# Patient Record
Sex: Male | Born: 1980 | Race: White | Hispanic: No | Marital: Married | Smoking: Never smoker
Health system: Southern US, Community
[De-identification: ages and names within clinical notes are randomized; demographics above are authoritative.]

## PROBLEM LIST (undated history)

## (undated) DIAGNOSIS — I10 Essential (primary) hypertension: Secondary | ICD-10-CM

## (undated) DIAGNOSIS — K200 Eosinophilic esophagitis: Secondary | ICD-10-CM

## (undated) DIAGNOSIS — Z8719 Personal history of other diseases of the digestive system: Secondary | ICD-10-CM

## (undated) DIAGNOSIS — K219 Gastro-esophageal reflux disease without esophagitis: Secondary | ICD-10-CM

## (undated) DIAGNOSIS — R002 Palpitations: Secondary | ICD-10-CM

## (undated) DIAGNOSIS — E559 Vitamin D deficiency, unspecified: Secondary | ICD-10-CM

## (undated) DIAGNOSIS — I493 Ventricular premature depolarization: Secondary | ICD-10-CM

## (undated) HISTORY — DX: Eosinophilic esophagitis: K20.0

## (undated) HISTORY — DX: Gastro-esophageal reflux disease without esophagitis: K21.9

## (undated) HISTORY — DX: Vitamin D deficiency, unspecified: E55.9

## (undated) HISTORY — DX: Palpitations: R00.2

---

## 2010-12-20 DIAGNOSIS — I493 Ventricular premature depolarization: Secondary | ICD-10-CM | POA: Insufficient documentation

## 2015-05-17 ENCOUNTER — Encounter: Payer: Self-pay | Admitting: Physician Assistant

## 2015-05-17 ENCOUNTER — Ambulatory Visit: Payer: Self-pay | Admitting: Physician Assistant

## 2015-05-17 VITALS — BP 132/82 | HR 65 | Temp 98.5°F

## 2015-05-17 DIAGNOSIS — R42 Dizziness and giddiness: Secondary | ICD-10-CM

## 2015-05-17 DIAGNOSIS — R002 Palpitations: Secondary | ICD-10-CM

## 2015-05-17 DIAGNOSIS — Z299 Encounter for prophylactic measures, unspecified: Secondary | ICD-10-CM

## 2015-05-17 NOTE — Patient Instructions (Signed)
Palpitations A palpitation is the feeling that your heartbeat is irregular. It may feel like your heart is fluttering or skipping a beat. It may also feel like your heart is beating faster than normal. This is usually not a serious problem. In some cases, you may need more medical tests. HOME CARE  Avoid:  Caffeine in coffee, tea, soft drinks, diet pills, and energy drinks.  Chocolate.  Alcohol.  Stop smoking if you smoke.  Reduce your stress and anxiety. Try:  A method that measures bodily functions so you can learn to control them (biofeedback).  Yoga.  Meditation.  Physical activity such as swimming, jogging, or walking.  Get plenty of rest and sleep. GET HELP IF:  Your fast or irregular heartbeat continues after 24 hours.  Your palpitations occur more often. GET HELP RIGHT AWAY IF:   You have chest pain.  You feel short of breath.  You have a very bad headache.  You feel dizzy or pass out (faint). MAKE SURE YOU:   Understand these instructions.  Will watch your condition.  Will get help right away if you are not doing well or get worse.   This information is not intended to replace advice given to you by your health care provider. Make sure you discuss any questions you have with your health care provider.   Document Released: 11/30/2007 Document Revised: 03/13/2014 Document Reviewed: 04/21/2011 Elsevier Interactive Patient Education 2016 Elsevier Inc. Dizziness Dizziness is a common problem. It makes you feel unsteady or lightheaded. You may feel like you are about to pass out (faint). Dizziness can lead to injury if you stumble or fall. Anyone can get dizzy, but dizziness is more common in older adults. This condition can be caused by a number of things, including:  Medicines.  Dehydration.  Illness. HOME CARE Following these instructions may help with your condition: Eating and Drinking  Drink enough fluid to keep your pee (urine) clear or pale  yellow. This helps to keep you from getting dehydrated. Try to drink more clear fluids, such as water.  Do not drink alcohol.  Limit how much caffeine you drink or eat if told by your doctor.  Limit how much salt you drink or eat if told by your doctor. Activity  Avoid making quick movements.  When you stand up from sitting in a chair, steady yourself until you feel okay.  In the morning, first sit up on the side of the bed. When you feel okay, stand slowly while you hold onto something. Do this until you know that your balance is fine.  Move your legs often if you need to stand in one place for a long time. Tighten and relax your muscles in your legs while you are standing.  Do not drive or use heavy machinery if you feel dizzy.  Avoid bending down if you feel dizzy. Place items in your home so that they are easy for you to reach without leaning over. Lifestyle  Do not use any tobacco products, including cigarettes, chewing tobacco, or electronic cigarettes. If you need help quitting, ask your doctor.  Try to lower your stress level, such as with yoga or meditation. Talk with your doctor if you need help. General Instructions  Watch your dizziness for any changes.  Take medicines only as told by your doctor. Talk with your doctor if you think that your dizziness is caused by a medicine that you are taking.  Tell a friend or a family member that you  are feeling dizzy. If he or she notices any changes in your behavior, have this person call your doctor.  Keep all follow-up visits as told by your doctor. This is important. GET HELP IF:  Your dizziness does not go away.  Your dizziness or light-headedness gets worse.  You feel sick to your stomach (nauseous).  You have trouble hearing.  You have new symptoms.  You are unsteady on your feet or you feel like the room is spinning. GET HELP RIGHT AWAY IF:  You throw up (vomit) or have diarrhea and are unable to eat or drink  anything.  You have trouble:  Talking.  Walking.  Swallowing.  Using your arms, hands, or legs.  You feel generally weak.  You are not thinking clearly or you have trouble forming sentences. It may take a friend or family member to notice this.  You have:  Chest pain.  Pain in your belly (abdomen).  Shortness of breath.  Sweating.  Your vision changes.  You are bleeding.  You have a headache.  You have neck pain or a stiff neck.  You have a fever.   This information is not intended to replace advice given to you by your health care provider. Make sure you discuss any questions you have with your health care provider.   Document Released: 02/09/2011 Document Revised: 07/07/2014 Document Reviewed: 02/16/2014 Elsevier Interactive Patient Education [employer].

## 2015-05-17 NOTE — Progress Notes (Signed)
S: here for eval of dizziness and sweating after eating, happened x 2, no cp/sob at time, sx went away but just wanted to f/u;  no hx of dm, takes bystolic for palpitations, followed by electrophysiologist in [place], Dr Posey Pronto with [facility];  otherwise pmhx is neg, fam hx neg, nonsmoker, etoh occasionally,   O: Vitals wnl, nad, lungs c t a, cv rrr, abd soft nontender bs normal all 4 quads, neuro intact  A: dizziness, palpitations well controlled with medication  P: labs today, drink plenty of fluids

## 2015-05-18 LAB — CMP12+LP+TP+TSH+6AC+PSA+CBC…
ALT: 26 IU/L (ref 0–44)
AST: 16 IU/L (ref 0–40)
Albumin/Globulin Ratio: 1.8 (ref 1.2–2.2)
Albumin: 4.6 g/dL (ref 3.5–5.5)
Alkaline Phosphatase: 69 IU/L (ref 39–117)
BASOS: 0 %
BUN/Creatinine Ratio: 12 (ref 8–19)
BUN: 11 mg/dL (ref 6–20)
Basophils Absolute: 0 10*3/uL (ref 0.0–0.2)
Bilirubin Total: 1 mg/dL (ref 0.0–1.2)
CALCIUM: 10 mg/dL (ref 8.7–10.2)
CHOL/HDL RATIO: 4.6 ratio (ref 0.0–5.0)
CREATININE: 0.95 mg/dL (ref 0.76–1.27)
Chloride: 100 mmol/L (ref 96–106)
Cholesterol, Total: 174 mg/dL (ref 100–199)
EOS (ABSOLUTE): 0.2 10*3/uL (ref 0.0–0.4)
ESTIMATED CHD RISK: 0.9 times avg. (ref 0.0–1.0)
Eos: 3 %
Free Thyroxine Index: 2.7 (ref 1.2–4.9)
GFR calc Af Amer: 119 mL/min/{1.73_m2} (ref >59)
GFR, EST NON AFRICAN AMERICAN: 103 mL/min/{1.73_m2} (ref >59)
GGT: 24 IU/L (ref 0–65)
GLUCOSE: 95 mg/dL (ref 65–99)
Globulin, Total: 2.6 g/dL (ref 1.5–4.5)
HDL: 38 mg/dL — ABNORMAL LOW (ref >39)
HEMATOCRIT: 47 % (ref 37.5–51.0)
Hemoglobin: 16.3 g/dL (ref 12.6–17.7)
IMMATURE GRANS (ABS): 0 10*3/uL (ref 0.0–0.1)
Immature Granulocytes: 0 %
Iron: 65 ug/dL (ref 38–169)
LDH: 124 IU/L (ref 121–224)
LDL Calculated: 119 mg/dL — ABNORMAL HIGH (ref 0–99)
LYMPHS ABS: 1.1 10*3/uL (ref 0.7–3.1)
LYMPHS: 22 %
MCH: 30.1 pg (ref 26.6–33.0)
MCHC: 34.7 g/dL (ref 31.5–35.7)
MCV: 87 fL (ref 79–97)
MONOS ABS: 0.4 10*3/uL (ref 0.1–0.9)
Monocytes: 9 %
Neutrophils Absolute: 3.3 10*3/uL (ref 1.4–7.0)
Neutrophils: 66 %
PHOSPHORUS: 3 mg/dL (ref 2.5–4.5)
POTASSIUM: 4 mmol/L (ref 3.5–5.2)
Platelets: 193 10*3/uL (ref 150–379)
Prostate Specific Ag, Serum: 1 ng/mL (ref 0.0–4.0)
RBC: 5.41 x10E6/uL (ref 4.14–5.80)
RDW: 13.8 % (ref 12.3–15.4)
SODIUM: 140 mmol/L (ref 134–144)
T3 Uptake Ratio: 28 % (ref 24–39)
T4 TOTAL: 9.5 ug/dL (ref 4.5–12.0)
TOTAL PROTEIN: 7.2 g/dL (ref 6.0–8.5)
TSH: 2.27 u[IU]/mL (ref 0.450–4.500)
Triglycerides: 84 mg/dL (ref 0–149)
URIC ACID: 5.8 mg/dL (ref 3.7–8.6)
VLDL Cholesterol Cal: 17 mg/dL (ref 5–40)
WBC: 5.1 10*3/uL (ref 3.4–10.8)

## 2015-05-18 LAB — VITAMIN D 25 HYDROXY (VIT D DEFICIENCY, FRACTURES): Vit D, 25-Hydroxy: 24 ng/mL — ABNORMAL LOW (ref 30.0–100.0)

## 2015-05-18 LAB — HEMOGLOBIN A1C
ESTIMATED AVERAGE GLUCOSE: 103 mg/dL
Hgb A1c MFr Bld: 5.2 % (ref 4.8–5.6)

## 2015-10-11 ENCOUNTER — Ambulatory Visit: Payer: Self-pay | Admitting: Physician Assistant

## 2015-10-11 ENCOUNTER — Encounter: Payer: Self-pay | Admitting: Physician Assistant

## 2015-10-11 VITALS — BP 109/70 | HR 63 | Temp 98.7°F

## 2015-10-11 DIAGNOSIS — J020 Streptococcal pharyngitis: Secondary | ICD-10-CM

## 2015-10-11 LAB — POCT RAPID STREP A (OFFICE): RAPID STREP A SCREEN: POSITIVE — AB

## 2015-10-11 MED ORDER — AMOXICILLIN 875 MG PO TABS: 875.0000 mg | ORAL_TABLET | Freq: Two times a day (BID) | ORAL | 0 refills | Status: DC

## 2015-10-11 NOTE — Progress Notes (Signed)
S: C/o sore throat and congestion for 3 days, no fever, chills, cp/sob, v/d; states son just diagnosed with strep throat and feels like his sx are a day behind his child's  Using otc meds:   O: PE: perrl eomi, normocephalic, tms dull, nasal mucosa red and swollen, throat injected, neck supple no lymph, lungs c t a, cv rrr, neuro intact, q strep +  A:  Acute strep pharyngitis  P: drink fluids, continue regular meds , use otc meds of choice, return if not improving in 5 days, return earlier if worsening , amoxil 875mg  bid x 10d

## 2016-07-07 ENCOUNTER — Ambulatory Visit: Payer: Self-pay | Admitting: Physician Assistant

## 2016-07-07 ENCOUNTER — Encounter: Payer: Self-pay | Admitting: Physician Assistant

## 2016-07-07 VITALS — BP 139/80 | HR 65 | Temp 98.4°F | Ht 73.0 in | Wt 255.0 lb

## 2016-07-07 DIAGNOSIS — Z0000 Encounter for general adult medical examination without abnormal findings: Secondary | ICD-10-CM

## 2016-07-07 DIAGNOSIS — Z0189 Encounter for other specified special examinations: Secondary | ICD-10-CM

## 2016-07-07 DIAGNOSIS — Z008 Encounter for other general examination: Secondary | ICD-10-CM

## 2016-07-07 NOTE — Progress Notes (Signed)
S: pt here for wellness physical and biometrics for insurance purposes, no complaints ros neg. PMH: palpitations (benign) followed by physiologist in [place]:  Nonsmoker, rare etoh, no drugs, married, Fam: parents are healthy , sister is healthy, denies CAD, cancer, dm, cholesterol problems in family hx  O: vitals wnl, nad, ENT wnl, neck supple no lymph, lungs c t a, cv rrr, abd soft nontender bs normal all 4 quads  A: wellness, biometric physical  P:  f/u prn, fasting labs today

## 2016-07-08 LAB — CMP12+LP+TP+TSH+6AC+CBC/D/PLT
ALBUMIN: 4.6 g/dL (ref 3.5–5.5)
ALT: 24 IU/L (ref 0–44)
AST: 23 IU/L (ref 0–40)
Albumin/Globulin Ratio: 1.8 (ref 1.2–2.2)
Alkaline Phosphatase: 75 IU/L (ref 39–117)
BASOS ABS: 0 10*3/uL (ref 0.0–0.2)
BASOS: 1 %
BILIRUBIN TOTAL: 2 mg/dL — AB (ref 0.0–1.2)
BUN / CREAT RATIO: 11 (ref 9–20)
BUN: 11 mg/dL (ref 6–20)
CALCIUM: 9.5 mg/dL (ref 8.7–10.2)
CHOLESTEROL TOTAL: 172 mg/dL (ref 100–199)
CREATININE: 0.97 mg/dL (ref 0.76–1.27)
Chloride: 99 mmol/L (ref 96–106)
Chol/HDL Ratio: 5.1 ratio — ABNORMAL HIGH (ref 0.0–5.0)
EOS (ABSOLUTE): 0.2 10*3/uL (ref 0.0–0.4)
EOS: 5 %
ESTIMATED CHD RISK: 1 times avg. (ref 0.0–1.0)
Free Thyroxine Index: 2.7 (ref 1.2–4.9)
GFR calc Af Amer: 115 mL/min/{1.73_m2} (ref >59)
GFR, EST NON AFRICAN AMERICAN: 99 mL/min/{1.73_m2} (ref >59)
GGT: 26 IU/L (ref 0–65)
GLUCOSE: 86 mg/dL (ref 65–99)
Globulin, Total: 2.6 g/dL (ref 1.5–4.5)
HDL: 34 mg/dL — ABNORMAL LOW (ref >39)
HEMOGLOBIN: 15.6 g/dL (ref 13.0–17.7)
Hematocrit: 44.7 % (ref 37.5–51.0)
IRON: 79 ug/dL (ref 38–169)
Immature Grans (Abs): 0 10*3/uL (ref 0.0–0.1)
Immature Granulocytes: 0 %
LDH: 161 IU/L (ref 121–224)
LDL Calculated: 107 mg/dL — ABNORMAL HIGH (ref 0–99)
LYMPHS ABS: 1.2 10*3/uL (ref 0.7–3.1)
Lymphs: 24 %
MCH: 29.3 pg (ref 26.6–33.0)
MCHC: 34.9 g/dL (ref 31.5–35.7)
MCV: 84 fL (ref 79–97)
MONOS ABS: 0.5 10*3/uL (ref 0.1–0.9)
Monocytes: 11 %
Neutrophils Absolute: 3 10*3/uL (ref 1.4–7.0)
Neutrophils: 59 %
PHOSPHORUS: 2.5 mg/dL (ref 2.5–4.5)
PLATELETS: 206 10*3/uL (ref 150–379)
Potassium: 4.9 mmol/L (ref 3.5–5.2)
RBC: 5.32 x10E6/uL (ref 4.14–5.80)
RDW: 13.5 % (ref 12.3–15.4)
SODIUM: 139 mmol/L (ref 134–144)
T3 UPTAKE RATIO: 31 % (ref 24–39)
T4, Total: 8.7 ug/dL (ref 4.5–12.0)
TSH: 2.34 u[IU]/mL (ref 0.450–4.500)
Total Protein: 7.2 g/dL (ref 6.0–8.5)
Triglycerides: 153 mg/dL — ABNORMAL HIGH (ref 0–149)
URIC ACID: 6.2 mg/dL (ref 3.7–8.6)
VLDL CHOLESTEROL CAL: 31 mg/dL (ref 5–40)
WBC: 4.9 10*3/uL (ref 3.4–10.8)

## 2016-07-08 LAB — VITAMIN D 25 HYDROXY (VIT D DEFICIENCY, FRACTURES): VIT D 25 HYDROXY: 24.1 ng/mL — AB (ref 30.0–100.0)

## 2016-07-08 LAB — HCV COMMENT:

## 2016-07-08 LAB — HEPATITIS C ANTIBODY (REFLEX)

## 2016-07-08 LAB — HIV ANTIBODY (ROUTINE TESTING W REFLEX): HIV Screen 4th Generation wRfx: NONREACTIVE

## 2016-08-18 ENCOUNTER — Encounter: Payer: Self-pay | Admitting: Family Medicine

## 2016-08-18 ENCOUNTER — Ambulatory Visit (INDEPENDENT_AMBULATORY_CARE_PROVIDER_SITE_OTHER): Payer: Managed Care, Other (non HMO) | Admitting: Family Medicine

## 2016-08-18 DIAGNOSIS — K219 Gastro-esophageal reflux disease without esophagitis: Secondary | ICD-10-CM | POA: Diagnosis not present

## 2016-08-18 DIAGNOSIS — G43109 Migraine with aura, not intractable, without status migrainosus: Secondary | ICD-10-CM

## 2016-08-18 DIAGNOSIS — I493 Ventricular premature depolarization: Secondary | ICD-10-CM

## 2016-08-18 MED ORDER — OMEPRAZOLE 40 MG PO CPDR: 40.0000 mg | DELAYED_RELEASE_CAPSULE | Freq: Every day | ORAL | 0 refills | Status: DC

## 2016-08-18 NOTE — Assessment & Plan Note (Signed)
Patient with history of complicated migraine. Has been stable for many years. No new symptoms. Was evaluated by neurology previously. Discussed monitoring. If he develops new or changing symptoms or persistent symptoms he should be evaluated.

## 2016-08-18 NOTE — Patient Instructions (Signed)
Nice to meet you. We will start you on Prilosec for reflux. Please take this daily for the next month and then see how your symptoms are doing. If they recur please let us know and we'll restart you on it and end up having you see GI. Please monitor your PVCs. If you have persistent symptoms please let us know or your cardiologist know. If you have any change in your complicated migraine symptoms please seek medical attention.

## 2016-08-18 NOTE — Progress Notes (Signed)
[name], MD Phone: [phone]  Eddie Lopez is a 36 y.o. male who presents today for new patient visit.  PVCs: Rarely get symptoms. Notes 1 week ago he had about 5-10 seconds of symptoms and he contacted his EP physician and they advised him to get a monitor so that he could check his heart rate when it occurs. Typically notes his blood pressures in the 120s over 70s. Takes Bystolic.  GERD: History of eosinophilic esophagitis. They tried steroids for that though that was not beneficial. He does get burning reflux symptoms fairly frequently. Does take Zantac and this is beneficial. Notes bread has been an exacerbating factor. EGD 5 years ago.  History of Migraine: No history of headache though about 10 years ago he started developing intermittent numbness that would alternate sides and typically occur in the upper and lower extremity on the same side at the same time. He saw neurology and they diagnosed it as a migraine variant. He's not had any changes in his symptoms over time. No persistent symptoms.  Active Ambulatory Problems    Diagnosis Date Noted  . PVC (premature ventricular contraction) 12/20/2010  . Complicated migraine 67/89/3810  . GERD (gastroesophageal reflux disease) 08/18/2016   Resolved Ambulatory Problems    Diagnosis Date Noted  . No Resolved Ambulatory Problems   Past Medical History:  Diagnosis Date  . GERD (gastroesophageal reflux disease)   . Palpitations     Family History  Problem Relation Age of Onset  . Throat cancer Maternal Grandfather     Social History   Social History  . Marital status: Married    Spouse name: N/A  . Number of children: N/A  . Years of education: N/A   Occupational History  . Not on file.   Social History Main Topics  . Smoking status: Never Smoker  . Smokeless tobacco: Never Used  . Alcohol use 0.0 oz/week     Comment: 1 beer a week at most  . Drug use: No  . Sexual activity: Yes   Other Topics Concern    . Not on file   Social History Narrative  . No narrative on file    ROS  General:  Negative for nexplained weight loss, fever Skin: Negative for new or changing mole, sore that won't heal HEENT: Negative for trouble hearing, trouble seeing, ringing in ears, mouth sores, hoarseness, change in voice, dysphagia. CV:  Positive for palpitations, Negative for chest pain, dyspnea, edema Resp: Negative for cough, dyspnea, hemoptysis GI: Negative for nausea, vomiting, diarrhea, constipation, abdominal pain, melena, hematochezia. GU: Negative for dysuria, incontinence, urinary hesitance, hematuria, vaginal or penile discharge, polyuria, sexual difficulty, lumps in testicle or breasts MSK: Negative for muscle cramps or aches, joint pain or swelling Neuro: Positive for numbness, Negative for headaches, weakness, dizziness, passing out/fainting Psych: Negative for depression, anxiety, memory problems  Objective  Physical Exam Vitals:   08/18/16 1319  BP: 140/88  Pulse: 65  Temp: 98.8 F (37.1 C)    BP Readings from Last 3 Encounters:  08/18/16 140/88  07/07/16 139/80  10/11/15 109/70   Wt Readings from Last 3 Encounters:  08/18/16 249 lb 3.2 oz (113 kg)  07/07/16 255 lb (115.7 kg)    Physical Exam  Constitutional: No distress.  HENT:  Head: Normocephalic and atraumatic.  Mouth/Throat: Oropharynx is clear and moist. No oropharyngeal exudate.  Eyes: Conjunctivae are normal. Pupils are equal, round, and reactive to light.  Cardiovascular: Normal rate, regular rhythm and normal heart sounds.  Pulmonary/Chest: Effort normal and breath sounds normal.  Abdominal: Soft. Bowel sounds are normal. He exhibits no distension. There is no tenderness. There is no rebound and no guarding.  Musculoskeletal: He exhibits no edema.  Neurological: He is alert. Gait normal.  CN 2-12 intact, 5/5 strength in bilateral biceps, triceps, grip, quads, hamstrings, plantar and dorsiflexion, sensation to  light touch intact in bilateral UE and LE  Skin: Skin is warm and dry. He is not diaphoretic.  Psychiatric: Mood and affect normal.     Assessment/Plan:   PVC (premature ventricular contraction) Occasional symptoms. Followed by EP. Advised if he develops persistent symptoms or new symptoms to be evaluated. He'll let us know when he wants to see a local cardiologist.  Complicated migraine Patient with history of complicated migraine. Has been stable for many years. No new symptoms. Was evaluated by neurology previously. Discussed monitoring. If he develops new or changing symptoms or persistent symptoms he should be evaluated.  GERD (gastroesophageal reflux disease) Long history of this. We'll trial Prilosec. If not improved after a month of use refer to GI.   No orders of the defined types were placed in this encounter.   Meds ordered this encounter  Medications  . omeprazole (PRILOSEC) 40 MG capsule    Sig: Take 1 capsule (40 mg total) by mouth daily.    Dispense:  30 capsule    Refill:  0     [name], MD [place]

## 2016-08-18 NOTE — Assessment & Plan Note (Signed)
Long history of this. We'll trial Prilosec. If not improved after a month of use refer to GI.

## 2016-08-18 NOTE — Assessment & Plan Note (Signed)
Occasional symptoms. Followed by EP. Advised if he develops persistent symptoms or new symptoms to be evaluated. He'll let us know when he wants to see a local cardiologist.

## 2016-09-19 ENCOUNTER — Encounter: Payer: Self-pay | Admitting: Family Medicine

## 2016-09-20 MED ORDER — OMEPRAZOLE 40 MG PO CPDR: 40.0000 mg | DELAYED_RELEASE_CAPSULE | Freq: Every day | ORAL | 0 refills | Status: DC

## 2016-09-20 NOTE — Telephone Encounter (Signed)
Was put on Prilosec on 6/15, seems to have worked, requesting refill and referral to GI, please advise, thanks

## 2016-09-21 ENCOUNTER — Other Ambulatory Visit: Payer: Self-pay | Admitting: Family Medicine

## 2016-10-19 ENCOUNTER — Other Ambulatory Visit: Payer: Self-pay | Admitting: Family Medicine

## 2016-10-19 ENCOUNTER — Other Ambulatory Visit: Payer: Self-pay

## 2016-10-20 MED ORDER — OMEPRAZOLE 40 MG PO CPDR: 40.0000 mg | DELAYED_RELEASE_CAPSULE | Freq: Every day | ORAL | 3 refills | Status: DC

## 2017-01-15 ENCOUNTER — Encounter: Payer: Self-pay | Admitting: Family Medicine

## 2017-01-15 DIAGNOSIS — K219 Gastro-esophageal reflux disease without esophagitis: Secondary | ICD-10-CM

## 2017-01-16 NOTE — Telephone Encounter (Signed)
Referral placed.

## 2017-01-19 ENCOUNTER — Ambulatory Visit: Payer: Managed Care, Other (non HMO) | Admitting: Gastroenterology

## 2017-01-19 ENCOUNTER — Other Ambulatory Visit: Payer: Self-pay

## 2017-01-19 ENCOUNTER — Encounter: Payer: Self-pay | Admitting: Gastroenterology

## 2017-01-19 VITALS — BP 129/73 | HR 66 | Temp 98.4°F | Ht 73.0 in | Wt 257.0 lb

## 2017-01-19 DIAGNOSIS — K200 Eosinophilic esophagitis: Secondary | ICD-10-CM

## 2017-01-19 DIAGNOSIS — K219 Gastro-esophageal reflux disease without esophagitis: Secondary | ICD-10-CM

## 2017-01-19 DIAGNOSIS — K2100 Gastro-esophageal reflux disease with esophagitis, without bleeding: Secondary | ICD-10-CM

## 2017-01-19 NOTE — Progress Notes (Signed)
Eddie Antigua, MD [address] [place], [place], [place], [address] [address], [place], [place], [place], [address] Phone: [phone]  Fax: [phone]  Consultation  Referring Provider:     [name], MD Primary Care Physician:  Eddie Haven, MD Primary Gastroenterologist:  Eddie Manifold, MD         Reason for consultaiton: Acid reflux ReDate of Consultation:  01/19/2017         HPI:   Eddie Lopez is a 36 y.o. male presents for evaluation of heartburn. Reportedly had an EGD 4-5 yrs ago for acid reflux.  He did not have any symptoms of food impaction at that time or now.  EGD reportedly showed eosinophilic esophagitis at the time.  He did not have any reports or pathology reports from this procedure.  He was started on PPI at that time and then transitioned over to a swallowed steroid.  He states the PPI helped with the swallowed steroid did not.  Over time he stopped taking both medications.  In June or July of this year his heartburn returned and his primary care provider prescribed Prilosec 40 mg daily in the morning.  This has helped his daytime symptoms but he is continuing to have heartburn at night.  No weight loss or abdominal pain.  No food impactions.  No dysphagia.  No nausea vomiting.  No altered bowel habits.  He elevates the head of his bed at night.  He tries to not eat right before bedtime.  Past Medical History:  Diagnosis Date  . GERD (gastroesophageal reflux disease)   . Palpitations     History reviewed. No pertinent surgical history.  Prior to Admission medications   Medication Sig Start Date End Date Taking? Authorizing Provider  BYSTOLIC 2.5 MG tablet  08/07/99  Yes [provider]  omeprazole (PRILOSEC) 40 MG capsule Take 1 capsule (40 mg total) by mouth daily. 10/20/16  Yes Eddie Haven, MD    Family History  Problem Relation Age of Onset  . Throat cancer Maternal Grandfather      Social History    Tobacco Use  . Smoking status: Never Smoker  . Smokeless tobacco: Never Used  Substance Use Topics  . Alcohol use: Yes    Alcohol/week: 0.0 oz    Comment: 1 beer a week at most  . Drug use: No    Allergies as of 01/19/2017  . (No Known Allergies)    Review of Systems:    All systems reviewed and negative except where noted in HPI.   Physical Exam:  Vital signs in last 24 hours: @VSRANGES @vital  signs reviewed recent labs   General:   Pleasant, cooperative in NAD Head:  Normocephalic and atraumatic. Eyes:   No icterus.   Conjunctiva pink. PERRLA. Ears:  Normal auditory acuity. Neck:  Supple; no masses or thyroidomegaly Lungs: Respirations even and unlabored. Lungs clear to auscultation bilaterally.   No wheezes, crackles, or rhonchi.  Heart:  Regular rate and rhythm;  Without murmur, clicks, rubs or gallops Abdomen:  Soft, nondistended, nontender. Normal bowel sounds. No appreciable masses or hepatomegaly.  No rebound or guarding.  Neurologic:  Alert and oriented x3;  grossly normal neurologically. Skin:  Intact without significant lesions or rashes. Cervical Nodes:  No significant cervical adenopathy. Psych:  Alert and cooperative. Normal affect.  LAB RESULTS: No results for input(s): WBC, HGB, HCT, PLT in the last 72 hours. BMET No results for input(s): NA, K, CL, CO2, GLUCOSE,  BUN, CREATININE, CALCIUM in the last 72 hours. LFT No results for input(s): PROT, ALBUMIN, AST, ALT, ALKPHOS, BILITOT, BILIDIR, IBILI in the last 72 hours. PT/INR No results for input(s): LABPROT, INR in the last 72 hours. Labs from May 2018 reviewed  STUDIES: No results found.    Impression / Plan:   Eddie Lopez is a 36 y.o. y/o male with symptoms of heartburn that have improved with Prilosec but are not completely resolved and reportedly history of eosinophilic esophagitis for 5 years ago when he presented with daily acid reflux  Due to his history of reported eosinophilic  esophagitis and symptoms of breakthrough acid reflux will perform EGD for evaluation of underlying eosinophilic esophagitis, Barrett's or reflux esophagitis.  Due to patient's  symptoms of heartburn at night despite PPI 40 mg in the morning, will have patient start taking Zantac at bedtime.  Education for acid reflux lifestyle modifications provided to patient and handout given for the same  Adverse effects of chronic PPIs including CKD, infections, pneumonia, C. difficile, bone loss, electrolyte abnormalities were discussed with the patient and he verbalized understanding  I have discussed alternative options, risks & benefits,  which include, but are not limited to, bleeding, infection, perforation,respiratory complication & drug reaction.  The patient agrees with this plan & written consent will be obtained.    Patient prefers to schedule this in January due to his insurance.  I have discussed any alarm symptoms that he should contact us for to discuss the need for doing this earlier.  These include symptoms of dysphagia, food impactions, weakness, abdominal pain, or any other reason for concern.  Of asked him to contact us if these occur or go to the ER  Thank you for involving me in the care of this patient.      Eddie Manifold, MD  01/19/2017, 11:11 AM

## 2017-02-21 ENCOUNTER — Encounter: Payer: Self-pay | Admitting: Family Medicine

## 2017-02-22 MED ORDER — OMEPRAZOLE 40 MG PO CPDR: 40.0000 mg | DELAYED_RELEASE_CAPSULE | Freq: Every day | ORAL | 1 refills | Status: DC

## 2017-03-08 ENCOUNTER — Encounter: Payer: Self-pay | Admitting: *Deleted

## 2017-03-09 ENCOUNTER — Ambulatory Visit: Payer: Managed Care, Other (non HMO) | Admitting: Anesthesiology

## 2017-03-09 ENCOUNTER — Ambulatory Visit
Admission: RE | Admit: 2017-03-09 | Discharge: 2017-03-09 | Disposition: A | Discharge status: Home / Self Care | Payer: Managed Care, Other (non HMO) | Source: Ambulatory Visit | Attending: Gastroenterology | Admitting: Gastroenterology

## 2017-03-09 ENCOUNTER — Encounter: Payer: Self-pay | Admitting: Anesthesiology

## 2017-03-09 ENCOUNTER — Encounter
Admission: RE | Disposition: A | Discharge status: Home / Self Care | Payer: Self-pay | Source: Ambulatory Visit | Attending: Gastroenterology

## 2017-03-09 ENCOUNTER — Other Ambulatory Visit: Payer: Self-pay

## 2017-03-09 DIAGNOSIS — K2100 Gastro-esophageal reflux disease with esophagitis, without bleeding: Secondary | ICD-10-CM

## 2017-03-09 DIAGNOSIS — K2289 Other specified disease of esophagus: Secondary | ICD-10-CM

## 2017-03-09 DIAGNOSIS — K228 Other specified diseases of esophagus: Secondary | ICD-10-CM | POA: Diagnosis not present

## 2017-03-09 DIAGNOSIS — K2950 Unspecified chronic gastritis without bleeding: Secondary | ICD-10-CM | POA: Insufficient documentation

## 2017-03-09 DIAGNOSIS — Z8719 Personal history of other diseases of the digestive system: Secondary | ICD-10-CM | POA: Insufficient documentation

## 2017-03-09 DIAGNOSIS — K209 Esophagitis, unspecified without bleeding: Secondary | ICD-10-CM

## 2017-03-09 DIAGNOSIS — K3189 Other diseases of stomach and duodenum: Secondary | ICD-10-CM

## 2017-03-09 DIAGNOSIS — R12 Heartburn: Secondary | ICD-10-CM | POA: Diagnosis present

## 2017-03-09 HISTORY — PX: ESOPHAGOGASTRODUODENOSCOPY (EGD) WITH PROPOFOL: SHX5813

## 2017-03-09 SURGERY — ESOPHAGOGASTRODUODENOSCOPY (EGD) WITH PROPOFOL
Anesthesia: General

## 2017-03-09 MED ORDER — MIDAZOLAM HCL 2 MG/2ML IJ SOLN
INTRAMUSCULAR | Status: DC | PRN
  Administered 2017-03-09: 2 mg via INTRAVENOUS

## 2017-03-09 MED ORDER — PROPOFOL 500 MG/50ML IV EMUL
INTRAVENOUS | Status: AC
  Filled 2017-03-09: qty 50

## 2017-03-09 MED ORDER — LIDOCAINE HCL (PF) 2 % IJ SOLN
INTRAMUSCULAR | Status: AC
Start: 2017-03-09 — End: 2017-03-09
  Filled 2017-03-09: qty 10

## 2017-03-09 MED ORDER — FENTANYL CITRATE (PF) 100 MCG/2ML IJ SOLN
INTRAMUSCULAR | Status: AC
  Filled 2017-03-09: qty 2

## 2017-03-09 MED ORDER — FENTANYL CITRATE (PF) 100 MCG/2ML IJ SOLN
INTRAMUSCULAR | Status: DC | PRN
  Administered 2017-03-09 (×2): 50 ug via INTRAVENOUS

## 2017-03-09 MED ORDER — SODIUM CHLORIDE 0.9 % IV SOLN
INTRAVENOUS | Status: DC
  Administered 2017-03-09: 1000 mL via INTRAVENOUS

## 2017-03-09 MED ORDER — PROPOFOL 500 MG/50ML IV EMUL
INTRAVENOUS | Status: DC | PRN
  Administered 2017-03-09: 120 ug/kg/min via INTRAVENOUS

## 2017-03-09 MED ORDER — MIDAZOLAM HCL 2 MG/2ML IJ SOLN
INTRAMUSCULAR | Status: AC
  Filled 2017-03-09: qty 2

## 2017-03-09 MED ORDER — LIDOCAINE HCL (CARDIAC) 20 MG/ML IV SOLN
INTRAVENOUS | Status: DC | PRN
  Administered 2017-03-09: 50 mg via INTRAVENOUS

## 2017-03-09 NOTE — Anesthesia Procedure Notes (Signed)
Performed by: Hedda Slade, CRNA Pre-anesthesia Checklist: Patient identified, Emergency Drugs available, Suction available, Patient being monitored and Timeout performed Patient Re-evaluated:Patient Re-evaluated prior to induction Oxygen Delivery Method: Nasal cannula Preoxygenation: Pre-oxygenation with 100% oxygen Induction Type: IV induction Airway Equipment and Method: Bite block Placement Confirmation: CO2 detector and positive ETCO2

## 2017-03-09 NOTE — Transfer of Care (Signed)
Immediate Anesthesia Transfer of Care Note  Patient: Eddie Lopez  Procedure(s) Performed: ESOPHAGOGASTRODUODENOSCOPY (EGD) WITH PROPOFOL (N/A )  Patient Location: PACU  Anesthesia Type:General  Level of Consciousness: awake and sedated  Airway & Oxygen Therapy: Patient Spontanous Breathing and Patient connected to nasal cannula oxygen  Post-op Assessment: Report given to RN and Post -op Vital signs reviewed and stable  Post vital signs: Reviewed and stable  Last Vitals:  Vitals:   03/09/17 1232  BP: 138/70  Pulse: 67  Resp: 18  Temp: (!) 36.3 C  SpO2: 100%    Last Pain:  Vitals:   03/09/17 1232  TempSrc: Tympanic         Complications: No apparent anesthesia complications

## 2017-03-09 NOTE — Anesthesia Postprocedure Evaluation (Signed)
Anesthesia Post Note  Patient: Eddie Lopez  Procedure(s) Performed: ESOPHAGOGASTRODUODENOSCOPY (EGD) WITH PROPOFOL (N/A )  Patient location during evaluation: Endoscopy Anesthesia Type: General Level of consciousness: awake and alert Pain management: pain level controlled Vital Signs Assessment: post-procedure vital signs reviewed and stable Respiratory status: spontaneous breathing, nonlabored ventilation, respiratory function stable and patient connected to nasal cannula oxygen Cardiovascular status: blood pressure returned to baseline and stable Postop Assessment: no apparent nausea or vomiting Anesthetic complications: no     Last Vitals:  Vitals:   03/09/17 1414 03/09/17 1424  BP: 103/73 102/65  Pulse: 60 (!) 58  Resp: 13 16  Temp:    SpO2: 98% 98%    Last Pain:  Vitals:   03/09/17 1404  TempSrc: Tympanic                 Martha Clan

## 2017-03-09 NOTE — Anesthesia Post-op Follow-up Note (Signed)
Anesthesia QCDR form completed.        

## 2017-03-09 NOTE — Anesthesia Preprocedure Evaluation (Signed)
Anesthesia Evaluation  Patient identified by MRN, date of birth, ID band Patient awake    Reviewed: Allergy & Precautions, NPO status , Patient's Chart, lab work & pertinent test results  History of Anesthesia Complications Negative for: history of anesthetic complications  Airway Mallampati: II       Dental   Pulmonary neg sleep apnea, neg COPD,           Cardiovascular (-) hypertension(-) Past MI and (-) CHF + dysrhythmias (occassional palpitations) (-) Valvular Problems/Murmurs     Neuro/Psych neg Seizures    GI/Hepatic Neg liver ROS, GERD  Medicated and Poorly Controlled,  Endo/Other  neg diabetes  Renal/GU negative Renal ROS     Musculoskeletal   Abdominal   Peds  Hematology   Anesthesia Other Findings   Reproductive/Obstetrics                            Anesthesia Physical Anesthesia Plan  ASA: II  Anesthesia Plan: General   Post-op Pain Management:    Induction: Intravenous  PONV Risk Score and Plan: 2 and TIVA and Propofol infusion  Airway Management Planned: Nasal Cannula  Additional Equipment:   Intra-op Plan:   Post-operative Plan:   Informed Consent: I have reviewed the patients History and Physical, chart, labs and discussed the procedure including the risks, benefits and alternatives for the proposed anesthesia with the patient or authorized representative who has indicated his/her understanding and acceptance.     Plan Discussed with:   Anesthesia Plan Comments:         Anesthesia Quick Evaluation

## 2017-03-09 NOTE — H&P (Signed)
  [name], MD [address], [place], [place], [place], [address] [address], [place], [place], [place], [address] Phone: [phone]  Fax: [phone]  Primary Care Physician:  [name], MD   Pre-Procedure History & Physical: HPI:  Eddie Lopez is a 37 y.o. male is here for an EGD.   Past Medical History:  Diagnosis Date  . GERD (gastroesophageal reflux disease)   . Palpitations     History reviewed. No pertinent surgical history.  Prior to Admission medications   Medication Sig Start Date End Date Taking? Authorizing Provider  BYSTOLIC 2.5 MG tablet  5/0/56   [provider]  omeprazole (PRILOSEC) 40 MG capsule Take 1 capsule (40 mg total) by mouth daily. 02/22/17   [name], MD    Allergies as of 01/19/2017  . (No Known Allergies)    Family History  Problem Relation Age of Onset  . Throat cancer Maternal Grandfather     Social History   Socioeconomic History  . Marital status: Married    Spouse name: Not on file  . Number of children: Not on file  . Years of education: Not on file  . Highest education level: Not on file  Social Needs  . Financial resource strain: Not on file  . Food insecurity - worry: Not on file  . Food insecurity - inability: Not on file  . Transportation needs - medical: Not on file  . Transportation needs - non-medical: Not on file  Occupational History  . Not on file  Tobacco Use  . Smoking status: Never Smoker  . Smokeless tobacco: Never Used  Substance and Sexual Activity  . Alcohol use: Yes    Alcohol/week: 0.0 oz    Comment: 1 beer a week at most  . Drug use: No  . Sexual activity: Yes  Other Topics Concern  . Not on file  Social History Narrative  . Not on file    Review of Systems: See HPI, otherwise negative ROS  Physical Exam: There were no vitals taken for this visit. General:   Alert,  pleasant and cooperative in NAD Head:  Normocephalic and atraumatic. Neck:   Supple; no masses or thyromegaly. Lungs:  Clear throughout to auscultation, normal respiratory effort.    Heart:  +S1, +S2, Regular rate and rhythm, No edema. Abdomen:  Soft, nontender and nondistended. Normal bowel sounds, without guarding, and without rebound.   Neurologic:  Alert and  oriented x4;  grossly normal neurologically.  Impression/Plan: Eddie Lopez is here for an EGD to be performed for Heartburn, EoE  Risks, benefits, limitations, and alternatives regarding EGD have been reviewed with the patient.  Questions have been answered.  All parties agreeable.   Virgel Manifold, MD  03/09/2017, 12:12 PM

## 2017-03-09 NOTE — Op Note (Signed)
[facility] Patient Name: Eddie Lopez Procedure Date: 03/09/2017 1:34 PM MRN: [id number] Account #: [id number] Date of Birth: 10-03-1980 Admit Type: Outpatient Age: 37 Room: [facility] ENDO ROOM 1 Gender: Male Note Status: Finalized Procedure:            Upper GI endoscopy Indications:          Heartburn, History of Eosinophilic Esophagitis reported                        by patient Providers:            Neelie Welshans B. Bonna Gains MD, MD Referring MD:         Angela Adam. Caryl Bis (Referring MD) Medicines:            Monitored Anesthesia Care Complications:        No immediate complications. Procedure:            Pre-Anesthesia Assessment:                       - The risks and benefits of the procedure and the                        sedation options and risks were discussed with the                        patient. All questions were answered and informed                        consent was obtained.                       - Patient identification and proposed procedure were                        verified prior to the procedure.                       - ASA Grade Assessment: II - A patient with mild                        systemic disease.                       After obtaining informed consent, the endoscope was                        passed under direct vision. Throughout the procedure,                        the patient's blood pressure, pulse, and oxygen                        saturations were monitored continuously. The Endoscope                        was introduced through the mouth, and advanced to the                        second part of duodenum. The upper GI endoscopy was  accomplished with ease. The patient tolerated the                        procedure well. Findings:      Mucosal changes including feline appearance and longitudinal furrows       were found in the entire esophagus. Biopsies were obtained from the   proximal and distal esophagus with cold forceps for histology of       suspected eosinophilic esophagitis.      The Z-line was found 39 cm from the incisors.      One tongue of salmon-colored mucosa was present from 38 to 39 cm. No       other visible abnormalities were present. The maximum longitudinal       extent of these esophageal mucosal changes was 1 cm in length. Biopsies       were taken with a cold forceps for histology.      Patchy mildly erythematous mucosa without bleeding was found in the       gastric antrum. Biopsies were taken with a cold forceps for histology.      The duodenal bulb and second portion of the duodenum were normal. Impression:           - Esophageal mucosal changes suggestive of eosinophilic                        esophagitis. Biopsied.                       - Z-line, 39 cm from the incisors.                       - Salmon-colored mucosa. Biopsied.                       - Erythematous mucosa in the antrum. Biopsied.                       - Normal duodenal bulb and second portion of the                        duodenum. Recommendation:       - Await pathology results.                       - Continue present medications.                       - Discharge patient to home.                       - Follow an antireflux regimen.                       - Return to my office in 4 weeks. Procedure Code(s):    --- Professional ---                       [phone], Esophagogastroduodenoscopy, flexible, transoral;                        with biopsy, single or multiple Diagnosis Code(s):    --- Professional ---  K20.9, Esophagitis, unspecified                       K22.8, Other specified diseases of esophagus                       K31.89, Other diseases of stomach and duodenum                       R12, Heartburn CPT copyright 2016 American Medical Association. All rights reserved. The codes documented in this report are preliminary and upon coder  review may  be revised to meet current compliance requirements.  [name], MD Margretta Sidle B. Bonna Gains MD, MD 03/09/2017 2:01:15 PM This report has been signed electronically. Number of Addenda: 0 Note Initiated On: 03/09/2017 1:34 PM      [facility]

## 2017-03-12 ENCOUNTER — Encounter: Payer: Self-pay | Admitting: Gastroenterology

## 2017-03-15 ENCOUNTER — Encounter: Payer: Self-pay | Admitting: Gastroenterology

## 2017-03-15 LAB — SURGICAL PATHOLOGY

## 2017-03-26 ENCOUNTER — Telehealth: Payer: Self-pay | Admitting: Gastroenterology

## 2017-03-26 NOTE — Telephone Encounter (Signed)
Left voice message for patient to call and move his appt. Dr is scoping

## 2017-04-10 ENCOUNTER — Ambulatory Visit: Payer: Managed Care, Other (non HMO) | Admitting: Gastroenterology

## 2017-04-12 ENCOUNTER — Telehealth: Payer: Self-pay

## 2017-04-12 ENCOUNTER — Encounter: Payer: Self-pay | Admitting: Gastroenterology

## 2017-04-12 ENCOUNTER — Ambulatory Visit: Payer: Managed Care, Other (non HMO) | Admitting: Gastroenterology

## 2017-04-12 ENCOUNTER — Other Ambulatory Visit: Payer: Self-pay

## 2017-04-12 VITALS — BP 138/83 | HR 66 | Ht 73.0 in | Wt 261.8 lb

## 2017-04-12 DIAGNOSIS — K2289 Other specified disease of esophagus: Secondary | ICD-10-CM

## 2017-04-12 DIAGNOSIS — K219 Gastro-esophageal reflux disease without esophagitis: Secondary | ICD-10-CM | POA: Diagnosis not present

## 2017-04-12 DIAGNOSIS — K228 Other specified diseases of esophagus: Secondary | ICD-10-CM | POA: Diagnosis not present

## 2017-04-12 DIAGNOSIS — R17 Unspecified jaundice: Secondary | ICD-10-CM

## 2017-04-12 MED ORDER — OMEPRAZOLE 20 MG PO CPDR: 20.0000 mg | DELAYED_RELEASE_CAPSULE | Freq: Every day | ORAL | 6 refills | Status: DC

## 2017-04-12 NOTE — Telephone Encounter (Signed)
Pt scheduled for ultrasound on 04/18/17 at 11:00 (arrival time 10:45) at the [facility] [address]. Pt to be NPO after midnight. Will have labs done also this day. Pt aware.

## 2017-04-12 NOTE — Telephone Encounter (Signed)
Left message to contact office regarding lab work needed and ultrasound needs to be ordered.

## 2017-04-12 NOTE — Patient Instructions (Signed)
F/U 6 months

## 2017-04-12 NOTE — Progress Notes (Signed)
Eddie Antigua, MD [address]  [place]  [place], [place] [address]  Main: [phone]  Fax: [phone]   Primary Care Physician: Eddie Haven, MD  Primary Gastroenterologist:  Dr. Vonda Lopez  Chief Complaint  Patient presents with  . Follow-up    EGD    HPI: Eddie Lopez is a 37 y.o. male here for follow-up.  He is currently on Protonix 40 mg every morning, and Zantac at bedtime for GERD versus EOE.  Since being on Zantac, starting January 19, 2017, his heartburn has completely resolved.  He has no symptoms of dysphagia, no previous food impactions, and no current acid reflux.  No abdominal pain.  No altered bowel habits.  No blood in stool.  No nausea vomiting.  He has instituted lifestyle measures, including weight loss, not eating 3 of his before bedtime, elevating head of bed at night, and avoiding exacerbating foods.  His EGD on March 09, 2017, showed endoscopic features of eosinophilic esophagitis.  Pathology report did not show any eosinophils in the distal or proximal esophagus, and reported changes compatible with reflux.  Stomach biopsies showed mild chronic gastritis, negative for H. pylori.  A small island of salmon colored mucosa and biopsies showed reflux gastroesophagitis, negative for goblet cells, dysplasia and malignancy.  As per previous notes: Reportedly had an EGD 4-5 yrs ago for acid reflux.  He did not have any symptoms of food impaction at that time or now.  EGD reportedly showed eosinophilic esophagitis at the time.  He did not have any reports or pathology reports from this procedure.  He was started on PPI at that time and then transitioned over to a swallowed steroid.  He states the PPI helped with the swallowed steroid did not.  Over time he stopped taking both medications.  In June or July of this year his heartburn returned and his primary care provider prescribed Prilosec 40 mg daily in the morning.  This has helped his  daytime symptoms but he is continuing to have heartburn at night.    Current Outpatient Medications  Medication Sig Dispense Refill  . BYSTOLIC 2.5 MG tablet   0   No current facility-administered medications for this visit.     Allergies as of 04/12/2017  . (No Known Allergies)    ROS:  General: Negative for anorexia, weight loss, fever, chills, fatigue, weakness. ENT: Negative for hoarseness, difficulty swallowing , nasal congestion. CV: Negative for chest pain, angina, palpitations, dyspnea on exertion, peripheral edema.  Respiratory: Negative for dyspnea at rest, dyspnea on exertion, cough, sputum, wheezing.  GI: See history of present illness. GU:  Negative for dysuria, hematuria, urinary incontinence, urinary frequency, nocturnal urination.  Endo: Negative for unusual weight change.    Physical Examination:   BP 138/83   Pulse 66   Ht 6\' 1"  (1.854 m)   Wt 261 lb 12.8 oz (118.8 kg)   BMI 34.54 kg/m   General: Well-nourished, well-developed in no acute distress.  Eyes: No icterus. Conjunctivae pink. Mouth: Oropharyngeal mucosa moist and pink , no lesions erythema or exudate. Lungs: Clear to auscultation bilaterally. Non-labored. Heart: Regular rate and rhythm, no murmurs rubs or gallops.  Abdomen: Bowel sounds are normal, nontender, nondistended, no hepatosplenomegaly or masses, no abdominal bruits or hernia , no rebound or guarding.   Extremities: No lower extremity edema. No clubbing or deformities. Neuro: Alert and oriented x 3.  Grossly intact. Skin: Warm and dry, no jaundice.   Psych: Alert and cooperative, normal  mood and affect.   Labs: CMP     Component Value Date/Time   NA 139 07/07/2016 0931   K 4.9 07/07/2016 0931   CL 99 07/07/2016 0931   GLUCOSE 86 07/07/2016 0931   BUN 11 07/07/2016 0931   CREATININE 0.97 07/07/2016 0931   CALCIUM 9.5 07/07/2016 0931   PROT 7.2 07/07/2016 0931   ALBUMIN 4.6 07/07/2016 0931   AST 23 07/07/2016 0931   ALT 24  07/07/2016 0931   ALKPHOS 75 07/07/2016 0931   BILITOT 2.0 (H) 07/07/2016 0931   GFRNONAA 99 07/07/2016 0931   GFRAA 115 07/07/2016 0931   Lab Results  Component Value Date   WBC 4.9 07/07/2016   HGB 15.6 07/07/2016   HCT 44.7 07/07/2016   MCV 84 07/07/2016   PLT 206 07/07/2016    Imaging Studies: No results found.  Assessment and Plan:   Christapher Eddie Lopez is a 37 y.o. y/o male here for follow-up of GERD versus EOE  Symptoms are very well controlled since instituting Zantac at bedtime, on January 19, 2017. We will decrease Protonix to 20 mg in the morning, and continue Zantac at bedtime.  This is to minimize any adverse effects from PPI use, and have patient on the lowest dose possible of the medication with control of symptoms.  Since patient has a history of lifestyle measures, and Zantac has improved his symptoms completely, if decreasing Protonix to 20 mg in the morning, these his symptoms well controlled, can discontinue Protonix in the future and continue Zantac at bedtime.  (Risks of PPI use were discussed with patient including bone loss, C. Diff diarrhea, pneumonia, infections, CKD, electrolyte abnormalities. Pt. Verbalizes understanding and chooses to continue the medication.)  The finding of salmon colored mucosa on on his EGD was discussed with the patient.  The biopsies did not show intestinal metaplasia or dysplasia.  However, the presence of salmon colored mucosa can signify Barrett's.  This was discussed with the patient.  In the absence of dysplasia the risk of cancer is very low.  The options of surveillance in 2-3 years versus no further surveillance was discussed with the patient extensively.  He chooses to continue surveillance in the future.  The importance of continuing acid reflux lifestyle modifications, and controlling the reflux to prevent further worsening of Barrett's was also discussed and he verbalized understanding.  His symptoms are currently well  controlled.  We will try to obtain his previous EGD and pathology report from [facility] gastroenterology.  It would be useful to see if any eosinophils were seen on his biopsy report initially.  As he has never had food impactions, suspect if he always just had GERD and not EOE  Interestingly I have also noted that his May 2018 labs shows elevated bilirubin at 2.  2014 labs also showed mildly elevated bilirubin at 1.3.  PCP did order hepatitis C antibody which was negative.  We will repeat bilirubin with direct bilirubin, obtain right upper quadrant ultrasound, and other hepatitis labs Dr Eddie Lopez

## 2017-04-17 ENCOUNTER — Telehealth: Payer: Self-pay | Admitting: Gastroenterology

## 2017-04-17 ENCOUNTER — Other Ambulatory Visit: Payer: Self-pay

## 2017-04-17 NOTE — Telephone Encounter (Signed)
Spoke with pt and informed that we will cancel ultrasound at [facility] and order it through [facility]. He will [facility] done [facility].

## 2017-04-17 NOTE — Telephone Encounter (Signed)
appt for Friday 04/20/17 for abdominal ultrasound at [place], [address] in [place].  Arrival time: 7:45am Ultrasound: 8:00am Nothing to eat or drink 8 hours prior to abdominal ultrasound complete.  If appt needs to be rescheduled, pt may contact [facility]. Order faxed to [facility]: # [phone] as requested.  Pt informed.

## 2017-04-17 NOTE — Telephone Encounter (Signed)
[facility]CO at [facility].

## 2017-04-17 NOTE — Telephone Encounter (Signed)
Patient called and wants to go to [facility]. This will save him money.  Its in [place].

## 2017-04-18 ENCOUNTER — Ambulatory Visit: Payer: Managed Care, Other (non HMO)

## 2017-04-23 ENCOUNTER — Other Ambulatory Visit: Payer: Self-pay

## 2017-04-23 DIAGNOSIS — Z299 Encounter for prophylactic measures, unspecified: Secondary | ICD-10-CM

## 2017-04-23 NOTE — Telephone Encounter (Signed)
Ultrasound reviewed from [facility] and showed fatty liver. Labs pending

## 2017-04-24 LAB — COMPREHENSIVE METABOLIC PANEL
ALT: 22 IU/L (ref 0–44)
AST: 15 IU/L (ref 0–40)
Albumin/Globulin Ratio: 1.6 (ref 1.2–2.2)
Albumin: 4.4 g/dL (ref 3.5–5.5)
Alkaline Phosphatase: 77 IU/L (ref 39–117)
BUN/Creatinine Ratio: 13 (ref 9–20)
BUN: 13 mg/dL (ref 6–20)
Bilirubin Total: 0.8 mg/dL (ref 0.0–1.2)
CO2: 23 mmol/L (ref 20–29)
Calcium: 9.5 mg/dL (ref 8.7–10.2)
Chloride: 102 mmol/L (ref 96–106)
Creatinine, Ser: 1 mg/dL (ref 0.76–1.27)
GFR calc Af Amer: 111 mL/min/{1.73_m2} (ref >59)
GFR calc non Af Amer: 96 mL/min/{1.73_m2} (ref >59)
Globulin, Total: 2.7 g/dL (ref 1.5–4.5)
Glucose: 99 mg/dL (ref 65–99)
Potassium: 4 mmol/L (ref 3.5–5.2)
Sodium: 142 mmol/L (ref 134–144)
Total Protein: 7.1 g/dL (ref 6.0–8.5)

## 2017-04-24 LAB — ANTI-SMOOTH MUSCLE ANTIBODY, IGG: Smooth Muscle Ab: 16 Units (ref 0–19)

## 2017-04-24 LAB — HEPATITIS B SURFACE ANTIBODY,QUALITATIVE: Hep B Surface Ab, Qual: REACTIVE

## 2017-04-24 LAB — HEPATITIS A ANTIBODY, IGM: Hep A IgM: NEGATIVE

## 2017-04-24 LAB — HEPATITIS B CORE ANTIBODY, TOTAL: Hep B Core Total Ab: NEGATIVE

## 2017-04-24 LAB — HEPATITIS B SURFACE ANTIGEN: Hepatitis B Surface Ag: NEGATIVE

## 2017-04-24 LAB — HEPATITIS B CORE ANTIBODY, IGM: Hep B C IgM: NEGATIVE

## 2017-04-24 LAB — BILIRUBIN, DIRECT: Bilirubin, Direct: 0.18 mg/dL (ref 0.00–0.40)

## 2017-04-24 LAB — ANA: Anti Nuclear Antibody(ANA): NEGATIVE

## 2017-04-24 LAB — MITOCHONDRIAL ANTIBODIES: Mitochondrial Ab: <20 Units (ref 0.0–20.0)

## 2017-04-24 LAB — HEPATITIS A ANTIBODY, TOTAL: Hep A Total Ab: POSITIVE — AB

## 2017-05-02 ENCOUNTER — Encounter: Payer: Self-pay | Admitting: Gastroenterology

## 2017-05-02 ENCOUNTER — Other Ambulatory Visit: Payer: Self-pay | Admitting: Gastroenterology

## 2017-05-02 DIAGNOSIS — K760 Fatty (change of) liver, not elsewhere classified: Secondary | ICD-10-CM

## 2017-05-15 ENCOUNTER — Other Ambulatory Visit: Payer: Self-pay

## 2017-05-15 DIAGNOSIS — K760 Fatty (change of) liver, not elsewhere classified: Secondary | ICD-10-CM

## 2017-05-16 ENCOUNTER — Other Ambulatory Visit: Payer: Self-pay | Admitting: Gastroenterology

## 2017-05-16 ENCOUNTER — Other Ambulatory Visit: Payer: Self-pay

## 2017-05-16 DIAGNOSIS — R161 Splenomegaly, not elsewhere classified: Secondary | ICD-10-CM

## 2017-05-17 LAB — CBC WITH DIFFERENTIAL/PLATELET
BASOS ABS: 0 10*3/uL (ref 0.0–0.2)
Basos: 1 %
EOS (ABSOLUTE): 0.1 10*3/uL (ref 0.0–0.4)
Eos: 2 %
Hematocrit: 46.5 % (ref 37.5–51.0)
Hemoglobin: 16.4 g/dL (ref 13.0–17.7)
IMMATURE GRANS (ABS): 0 10*3/uL (ref 0.0–0.1)
Immature Granulocytes: 0 %
LYMPHS: 19 %
Lymphocytes Absolute: 0.8 10*3/uL (ref 0.7–3.1)
MCH: 29.4 pg (ref 26.6–33.0)
MCHC: 35.3 g/dL (ref 31.5–35.7)
MCV: 83 fL (ref 79–97)
MONOS ABS: 0.4 10*3/uL (ref 0.1–0.9)
Monocytes: 9 %
NEUTROS ABS: 2.9 10*3/uL (ref 1.4–7.0)
NEUTROS PCT: 69 %
Platelets: 205 10*3/uL (ref 150–379)
RBC: 5.58 x10E6/uL (ref 4.14–5.80)
RDW: 13.7 % (ref 12.3–15.4)
WBC: 4.2 10*3/uL (ref 3.4–10.8)

## 2017-05-17 LAB — FERRITIN: FERRITIN: 239 ng/mL (ref 30–400)

## 2017-05-17 LAB — HCV COMMENT:

## 2017-05-17 LAB — HEPATITIS C ANTIBODY (REFLEX)

## 2017-05-17 LAB — CERULOPLASMIN: Ceruloplasmin: 23.8 mg/dL (ref 16.0–31.0)

## 2017-05-20 DIAGNOSIS — R161 Splenomegaly, not elsewhere classified: Secondary | ICD-10-CM | POA: Insufficient documentation

## 2017-05-20 NOTE — Progress Notes (Signed)
[facility]  [phone] [phone]  ID: Eddie Lopez OB: [birth year]  MR#: [id number]  BPZ#:[id number]  Patient Care Team: [name], MD as PCP - General (Family Medicine)  CHIEF COMPLAINT: Splenomegaly.  INTERVAL HISTORY: Patient is a 37 year old male who had an ultrasound to evaluate an elevated bilirubin was noted to have a mildly enlarged spleen.  He currently feels well and is asymptomatic.  He denies any fevers, weight loss, or night sweats.  He has no neurologic complaints.  He denies any chest pain, shortness of breath, or cough.  He denies any early satiety.  He has no nausea, vomiting, constipation, or diarrhea.  He denies any melena or hematochezia.  He has no urinary complaints.  Patient feels at his baseline offers no specific complaints today.  REVIEW OF SYSTEMS:   Review of Systems  Constitutional: Negative.  Negative for fever, malaise/fatigue and weight loss.  Respiratory: Negative.  Negative for cough and shortness of breath.   Cardiovascular: Negative.  Negative for chest pain and leg swelling.  Gastrointestinal: Negative.  Negative for abdominal pain, blood in stool, heartburn and melena.  Genitourinary: Negative.  Negative for dysuria.  Musculoskeletal: Negative.   Skin: Negative.  Negative for rash.  Neurological: Negative for sensory change, focal weakness and weakness.  Psychiatric/Behavioral: Negative.  The patient is not nervous/anxious.     As per HPI. Otherwise, a complete review of systems is negative.  PAST MEDICAL HISTORY: Past Medical History:  Diagnosis Date  . GERD (gastroesophageal reflux disease)   . Palpitations     PAST SURGICAL HISTORY: Past Surgical History:  Procedure Laterality Date  . ESOPHAGOGASTRODUODENOSCOPY (EGD) WITH PROPOFOL N/A 03/09/2017   Procedure: ESOPHAGOGASTRODUODENOSCOPY (EGD) WITH PROPOFOL;  Surgeon: Virgel Manifold, MD;  Location: [facility] ENDOSCOPY;  Service:  Endoscopy;  Laterality: N/A;    FAMILY HISTORY: Family History  Problem Relation Age of Onset  . Throat cancer Maternal Grandfather   . Cancer Maternal Grandmother   . Cancer Paternal Grandmother     ADVANCED DIRECTIVES (Y/N):  N  HEALTH MAINTENANCE: Social History   Tobacco Use  . Smoking status: Never Smoker  . Smokeless tobacco: Never Used  Substance Use Topics  . Alcohol use: Yes    Alcohol/week: 0.0 oz    Comment: 1 beer a week at most  . Drug use: No     Colonoscopy:  PAP:  Bone density:  Lipid panel:  No Known Allergies  Current Outpatient Medications  Medication Sig Dispense Refill  . BYSTOLIC 2.5 MG tablet   0  . omeprazole (PRILOSEC) 20 MG capsule TAKE ONE CAPSULE( 20 MG TOTAL) BY MOUTH DAILY 30 capsule 0   No current facility-administered medications for this visit.     OBJECTIVE: Vitals:   05/22/17 1029  BP: 123/83  Pulse: 60  Temp: 98 F (36.7 C)     Body mass index is 34.04 kg/m.    ECOG FS:0 - Asymptomatic  General: Well-developed, well-nourished, no acute distress. Eyes: Pink conjunctiva, anicteric sclera. HEENT: Normocephalic, moist mucous membranes, clear oropharnyx. Lungs: Clear to auscultation bilaterally. Heart: Regular rate and rhythm. No rubs, murmurs, or gallops. Abdomen: Soft, nontender, nondistended. No organomegaly noted, normoactive bowel sounds. Musculoskeletal: No edema, cyanosis, or clubbing. Neuro: Alert, answering all questions appropriately. Cranial nerves grossly intact. Skin: No rashes or petechiae noted. Psych: Normal affect. Lymphatics: No cervical, calvicular, axillary or inguinal LAD.   LAB RESULTS:  Lab Results  Component Value Date   NA 142 04/23/2017  K 4.0 04/23/2017   CL 102 04/23/2017   CO2 23 04/23/2017   GLUCOSE 99 04/23/2017   BUN 13 04/23/2017   CREATININE 1.00 04/23/2017   CALCIUM 9.5 04/23/2017   PROT 7.1 04/23/2017   ALBUMIN 4.4 04/23/2017   AST 15 04/23/2017   ALT 22 04/23/2017    ALKPHOS 77 04/23/2017   BILITOT 0.8 04/23/2017   GFRNONAA 96 04/23/2017   GFRAA 111 04/23/2017    Lab Results  Component Value Date   WBC 4.2 05/16/2017   NEUTROABS 2.9 05/16/2017   HGB 16.4 05/16/2017   HCT 46.5 05/16/2017   MCV 83 05/16/2017   PLT 205 05/16/2017     STUDIES: No results found.  ASSESSMENT: Splenomegaly  PLAN:    1. Splenomegaly: Patient noted to have a spleen of 14.8 cm on outside ultrasound.  All of his laboratory work including CBC is within normal limits.  Have ordered peripheral blood flow cytometry for completeness which is pending at time of dictation.  Will get a CT of the abdomen and pelvis with contrast in the next 1-2 weeks.  If there are any suspicious lesions within his spleen or elsewhere, patient will follow-up after his imaging to discuss the results.  If the CT scan is negative, no further follow-up is necessary.  Approximately 45 minutes was spent in discussion of which greater than 50% was consultation.  Patient expressed understanding and was in agreement with this plan. He also understands that He can call clinic at any time with any questions, concerns, or complaints.   Cancer Staging No matching staging information was found for the patient.  Lloyd Huger, MD   05/22/2017 1:26 PM

## 2017-05-22 ENCOUNTER — Inpatient Hospital Stay: Payer: Managed Care, Other (non HMO)

## 2017-05-22 ENCOUNTER — Encounter: Payer: Self-pay | Admitting: Gastroenterology

## 2017-05-22 ENCOUNTER — Other Ambulatory Visit: Payer: Self-pay

## 2017-05-22 ENCOUNTER — Encounter: Payer: Self-pay | Admitting: Oncology

## 2017-05-22 ENCOUNTER — Inpatient Hospital Stay: Payer: Managed Care, Other (non HMO) | Attending: Oncology | Admitting: Oncology

## 2017-05-22 DIAGNOSIS — R161 Splenomegaly, not elsewhere classified: Secondary | ICD-10-CM

## 2017-05-22 NOTE — Progress Notes (Signed)
New patient in for review of lab work.

## 2017-05-24 LAB — COMP PANEL: LEUKEMIA/LYMPHOMA

## 2017-05-29 ENCOUNTER — Other Ambulatory Visit: Payer: Self-pay | Admitting: *Deleted

## 2017-05-29 ENCOUNTER — Ambulatory Visit: Admission: RE | Admit: 2017-05-29 | Payer: Managed Care, Other (non HMO) | Source: Ambulatory Visit

## 2017-05-29 DIAGNOSIS — R161 Splenomegaly, not elsewhere classified: Secondary | ICD-10-CM

## 2017-05-30 ENCOUNTER — Ambulatory Visit
Admission: RE | Admit: 2017-05-30 | Discharge: 2017-05-30 | Disposition: A | Discharge status: Home / Self Care | Payer: Managed Care, Other (non HMO) | Source: Ambulatory Visit | Attending: Oncology | Admitting: Oncology

## 2017-05-30 ENCOUNTER — Inpatient Hospital Stay: Payer: Managed Care, Other (non HMO)

## 2017-05-30 DIAGNOSIS — R161 Splenomegaly, not elsewhere classified: Secondary | ICD-10-CM

## 2017-05-30 LAB — URINALYSIS, COMPLETE (UACMP) WITH MICROSCOPIC
BILIRUBIN URINE: NEGATIVE
Bacteria, UA: NONE SEEN
GLUCOSE, UA: NEGATIVE mg/dL
Hgb urine dipstick: NEGATIVE
Ketones, ur: NEGATIVE mg/dL
Leukocytes, UA: NEGATIVE
Nitrite: NEGATIVE
PROTEIN: NEGATIVE mg/dL
RBC / HPF: NONE SEEN RBC/hpf (ref 0–5)
SPECIFIC GRAVITY, URINE: 1.003 — AB (ref 1.005–1.030)
Squamous Epithelial / LPF: NONE SEEN
pH: 6 (ref 5.0–8.0)

## 2017-06-04 ENCOUNTER — Encounter: Payer: Self-pay | Admitting: Oncology

## 2017-06-11 ENCOUNTER — Encounter: Payer: Self-pay | Admitting: *Deleted

## 2017-06-19 NOTE — Progress Notes (Signed)
ERROR

## 2017-07-19 ENCOUNTER — Encounter: Payer: Self-pay | Admitting: Family Medicine

## 2017-07-19 ENCOUNTER — Ambulatory Visit (INDEPENDENT_AMBULATORY_CARE_PROVIDER_SITE_OTHER): Payer: Managed Care, Other (non HMO) | Admitting: Family Medicine

## 2017-07-19 ENCOUNTER — Other Ambulatory Visit: Payer: Self-pay

## 2017-07-19 VITALS — BP 122/70 | HR 62 | Temp 98.3°F | Ht 74.0 in | Wt 255.2 lb

## 2017-07-19 DIAGNOSIS — E669 Obesity, unspecified: Secondary | ICD-10-CM

## 2017-07-19 DIAGNOSIS — Z13220 Encounter for screening for lipoid disorders: Secondary | ICD-10-CM | POA: Diagnosis not present

## 2017-07-19 DIAGNOSIS — R161 Splenomegaly, not elsewhere classified: Secondary | ICD-10-CM

## 2017-07-19 DIAGNOSIS — Z0001 Encounter for general adult medical examination with abnormal findings: Secondary | ICD-10-CM | POA: Insufficient documentation

## 2017-07-19 DIAGNOSIS — Z0000 Encounter for general adult medical examination without abnormal findings: Secondary | ICD-10-CM

## 2017-07-19 LAB — COMPREHENSIVE METABOLIC PANEL
ALBUMIN: 4.5 g/dL (ref 3.5–5.2)
ALT: 23 U/L (ref 0–53)
AST: 16 U/L (ref 0–37)
Alkaline Phosphatase: 70 U/L (ref 39–117)
BUN: 12 mg/dL (ref 6–23)
CHLORIDE: 99 meq/L (ref 96–112)
CO2: 29 mEq/L (ref 19–32)
Calcium: 10 mg/dL (ref 8.4–10.5)
Creatinine, Ser: 0.95 mg/dL (ref 0.40–1.50)
GFR: 94.27 mL/min (ref >60.00)
Glucose, Bld: 99 mg/dL (ref 70–99)
POTASSIUM: 4 meq/L (ref 3.5–5.1)
SODIUM: 136 meq/L (ref 135–145)
Total Bilirubin: 1.5 mg/dL — ABNORMAL HIGH (ref 0.2–1.2)
Total Protein: 7.6 g/dL (ref 6.0–8.3)

## 2017-07-19 LAB — LIPID PANEL
CHOL/HDL RATIO: 5
CHOLESTEROL: 181 mg/dL (ref 0–200)
HDL: 36.9 mg/dL — ABNORMAL LOW (ref >39.00)
LDL CALC: 109 mg/dL — AB (ref 0–99)
NonHDL: 144.12
Triglycerides: 176 mg/dL — ABNORMAL HIGH (ref 0.0–149.0)
VLDL: 35.2 mg/dL (ref 0.0–40.0)

## 2017-07-19 LAB — HEMOGLOBIN A1C: HEMOGLOBIN A1C: 5.1 % (ref 4.6–6.5)

## 2017-07-19 NOTE — Progress Notes (Signed)
[name], MD Phone: [phone]  Eddie Lopez is a 37 y.o. male who presents today for CPE.  Exercises by walking a couple days a week. Eats lean meats.  High fiber.  Needs to increase fruits and vegetables.  No soda.  Some sweet tea on the weekend. Reports tetanus vaccination within the last 3 years.  Flu vaccination up-to-date. HIV screening up-to-date. No family history of colon cancer or prostate cancer. No tobacco use or illicit drug use.  He drinks less than a beer a week. He sees a Pharmacist, community twice a year.  Ophthalmologist once a year. He has been following with GI for reflux and was noted to have an elevated bilirubin.  He notes it has been elevated over the last 20 years.  He underwent an extensive lab evaluation for this and was referred to hematology given that they found splenomegaly.  They were to have a CT scan done though his insurance would not approve this.  Active Ambulatory Problems    Diagnosis Date Noted  . PVC (premature ventricular contraction) 12/20/2010  . Complicated migraine 59/29/2446  . GERD (gastroesophageal reflux disease) 08/18/2016  . Esophagitis, unspecified   . Stomach irritation   . Columnar epithelial-lined lower esophagus   . Splenomegaly 05/20/2017  . Routine general medical examination at a health care facility 07/19/2017   Resolved Ambulatory Problems    Diagnosis Date Noted  . No Resolved Ambulatory Problems   Past Medical History:  Diagnosis Date  . GERD (gastroesophageal reflux disease)   . Palpitations     Family History  Problem Relation Age of Onset  . Throat cancer Maternal Grandfather   . Cancer Maternal Grandmother   . Cancer Paternal Grandmother     Social History   Socioeconomic History  . Marital status: Married    Spouse name: Not on file  . Number of children: Not on file  . Years of education: Not on file  . Highest education level: Not on file  Occupational History  . Not on file  Social Needs  .  Financial resource strain: Not on file  . Food insecurity:    Worry: Not on file    Inability: Not on file  . Transportation needs:    Medical: Not on file    Non-medical: Not on file  Tobacco Use  . Smoking status: Never Smoker  . Smokeless tobacco: Never Used  Substance and Sexual Activity  . Alcohol use: Yes    Alcohol/week: 0.0 oz    Comment: 1 beer a week at most  . Drug use: No  . Sexual activity: Yes  Lifestyle  . Physical activity:    Days per week: Not on file    Minutes per session: Not on file  . Stress: Not on file  Relationships  . Social connections:    Talks on phone: Not on file    Gets together: Not on file    Attends religious service: Not on file    Active member of club or organization: Not on file    Attends meetings of clubs or organizations: Not on file    Relationship status: Not on file  . Intimate partner violence:    Fear of current or ex partner: Not on file    Emotionally abused: Not on file    Physically abused: Not on file    Forced sexual activity: Not on file  Other Topics Concern  . Not on file  Social History Narrative  . Not on file  ROS  General:  Negative for nexplained weight loss, fever Skin: Negative for new or changing mole, sore that won't heal HEENT: Negative for trouble hearing, trouble seeing, ringing in ears, mouth sores, hoarseness, change in voice, dysphagia. CV:  Negative for chest pain, dyspnea, edema, palpitations Resp: Negative for cough, dyspnea, hemoptysis GI: Negative for nausea, vomiting, diarrhea, constipation, abdominal pain, melena, hematochezia. GU: Negative for dysuria, incontinence, urinary hesitance, hematuria, vaginal or penile discharge, polyuria, sexual difficulty, lumps in testicle or breasts MSK: Negative for muscle cramps or aches, joint pain or swelling Neuro: Negative for headaches, weakness, numbness, dizziness, passing out/fainting Psych: Negative for depression, anxiety, memory  problems  Objective  Physical Exam Vitals:   07/19/17 0921  BP: 122/70  Pulse: 62  Temp: 98.3 F (36.8 C)  SpO2: 98%    BP Readings from Last 3 Encounters:  07/19/17 122/70  05/22/17 123/83  04/12/17 138/83   Wt Readings from Last 3 Encounters:  07/19/17 255 lb 3.2 oz (115.8 kg)  05/22/17 258 lb (117 kg)  04/12/17 261 lb 12.8 oz (118.8 kg)    Physical Exam  Constitutional: No distress.  HENT:  Head: Normocephalic and atraumatic.  Mouth/Throat: Oropharynx is clear and moist.  Eyes: Pupils are equal, round, and reactive to light. Conjunctivae are normal.  Neck: Neck supple.  Cardiovascular: Normal rate, regular rhythm and normal heart sounds.  Pulmonary/Chest: Effort normal and breath sounds normal.  Abdominal: Soft. Bowel sounds are normal. He exhibits no distension. There is no tenderness. There is no rebound and no guarding.  Musculoskeletal: He exhibits no edema.  Lymphadenopathy:    He has no cervical adenopathy.  Neurological: He is alert.  Skin: Skin is warm and dry. He is not diaphoretic.  Psychiatric: He has a normal mood and affect.     Assessment/Plan:   Routine general medical examination at a health care facility Physical exam completed.  He will increase exercise.  He will increase fruits and vegetables.  We will request his tetanus vaccination records.  Lab work as outlined below.  Splenomegaly Noted on prior ultrasound.  They were unable to get a CT scan approved.  He will contact GI to follow-up on this.   Orders Placed This Encounter  Procedures  . Lipid panel  . Comp Met (CMET)  . HgB A1c    No orders of the defined types were placed in this encounter.    [name], MD [place]

## 2017-07-19 NOTE — Assessment & Plan Note (Signed)
Physical exam completed.  He will increase exercise.  He will increase fruits and vegetables.  We will request his tetanus vaccination records.  Lab work as outlined below.

## 2017-07-19 NOTE — Patient Instructions (Addendum)
Nice to see you. Please try to increase the amount to walk per week. Please try to increase fruits and vegetables in your diet. We will request your tetanus vaccination records. Please call your GI or hematology physician to see what the next step is.

## 2017-07-19 NOTE — Assessment & Plan Note (Signed)
Noted on prior ultrasound.  They were unable to get a CT scan approved.  He will contact GI to follow-up on this.

## 2017-07-23 ENCOUNTER — Telehealth: Payer: Self-pay

## 2017-07-23 NOTE — Telephone Encounter (Signed)
Left detailed message to notify form is at front desk ready to be picked up

## 2017-09-04 ENCOUNTER — Encounter: Payer: Self-pay | Admitting: Gastroenterology

## 2017-09-04 ENCOUNTER — Ambulatory Visit: Payer: Managed Care, Other (non HMO) | Admitting: Gastroenterology

## 2017-09-04 VITALS — BP 117/80 | HR 56 | Ht 73.0 in | Wt 256.0 lb

## 2017-09-04 DIAGNOSIS — R945 Abnormal results of liver function studies: Secondary | ICD-10-CM | POA: Diagnosis not present

## 2017-09-04 DIAGNOSIS — R12 Heartburn: Secondary | ICD-10-CM | POA: Diagnosis not present

## 2017-09-04 DIAGNOSIS — K760 Fatty (change of) liver, not elsewhere classified: Secondary | ICD-10-CM

## 2017-09-04 NOTE — Patient Instructions (Signed)
F/U 3 months Contact Imaging at [phone] to schedule your CT.

## 2017-09-05 NOTE — Progress Notes (Signed)
[name], MD [address]  [place]  [place], [place] [address]  Main: [phone]  Fax: [phone]   Primary Care Physician: [name], MD  Primary Gastroenterologist:  Dr. [name]  Chief Complaint  Patient presents with  . Follow-up    HPI: Eddie Lopez is a 37 y.o. male here for follow-up of GERD.  Patient currently on Prilosec 20 mg daily, with good symptom control.  Reports breakthrough symptoms only once or twice a week.  No dysphagia.  No weight loss, no abdominal pain.  No altered bowel habits.    On last visit, we also noted that his May 2018 labs showed mildly elevated bilirubin chronically.  Liver work-up, and abdominal ultrasound were ordered.  Hepatic steatosis was reported.  Blood work was negative for viral and autoimmune hepatitis.  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  The ultrasound also reported splenomegaly with spleen measuring 14.8 cm.  He was referred to hematology by his primary care provider.  And they have ordered work-up which has been unrevealing.  Dr. Grayland Ormond, wanted to obtain a CT scan of his abdomen, but was not approved by insurance.  Previous history: His EGD on March 09, 2017, showed endoscopic features of eosinophilic esophagitis.  Pathology report did not show any eosinophils in the distal or proximal esophagus, and reported changes compatible with reflux.  Stomach biopsies showed mild chronic gastritis, negative for H. pylori.  A small island of salmon colored mucosa and biopsies showed reflux gastroesophagitis, negative for goblet cells, dysplasia and malignancy.  We have tried sending record release to his [employer] where the EGD was done, but have not received any records from them despite multiple attempts.  As per previous notes: Reportedly had an EGD 4-5 yrs ago for acid reflux.He did not  have any symptoms of food impaction at that time or now. EGD reportedly showed eosinophilic esophagitis at the time. He did not have any reports or pathology reports from this procedure. He was started on PPI at that time and then transitioned over to a swallowed steroid. He states the PPI helped with the swallowed steroid did not. Over time he stopped taking both medications. In June or July of this year his heartburn returned and his primary care provider prescribed Prilosec 40 mg daily in the morning. This has helped his daytime symptoms but he is continuing to have heartburn at night.      Current Outpatient Medications  Medication Sig Dispense Refill  . BYSTOLIC 2.5 MG tablet   0  . omeprazole (PRILOSEC) 20 MG capsule TAKE ONE CAPSULE( 20 MG TOTAL) BY MOUTH DAILY 30 capsule 0   No current facility-administered medications for this visit.     Allergies as of 09/04/2017  . (No Known Allergies)    ROS:  General: Negative for anorexia, weight loss, fever, chills, fatigue, weakness. ENT: Negative for hoarseness, difficulty swallowing , nasal congestion. CV: Negative for chest pain, angina, palpitations, dyspnea on exertion, peripheral edema.  Respiratory: Negative for dyspnea at rest, dyspnea on exertion, cough, sputum, wheezing.  GI: See history of present illness. GU:  Negative for dysuria, hematuria, urinary incontinence, urinary frequency, nocturnal urination.  Endo: Negative for unusual weight change.    Physical Examination:   BP 117/80   Pulse (!) 56   Ht 6\' 1"  (1.854 m)   Wt 256 lb (116.1 kg)   BMI 33.78 kg/m   General: Well-nourished, well-developed  in no acute distress.  Eyes: No icterus. Conjunctivae pink. Mouth: Oropharyngeal mucosa moist and pink , no lesions erythema or exudate. Neck: Supple, Trachea midline Abdomen: Bowel sounds are normal, nontender, nondistended, no hepatosplenomegaly or masses, no abdominal bruits or hernia , no rebound or guarding.     Extremities: No lower extremity edema. No clubbing or deformities. Neuro: Alert and oriented x 3.  Grossly intact. Skin: Warm and dry, no jaundice.   Psych: Alert and cooperative, normal mood and affect.   Labs: CMP     Component Value Date/Time   NA 136 07/19/2017 0949   NA 142 04/23/2017 0815   K 4.0 07/19/2017 0949   CL 99 07/19/2017 0949   CO2 29 07/19/2017 0949   GLUCOSE 99 07/19/2017 0949   BUN 12 07/19/2017 0949   BUN 13 04/23/2017 0815   CREATININE 0.95 07/19/2017 0949   CALCIUM 10.0 07/19/2017 0949   PROT 7.6 07/19/2017 0949   PROT 7.1 04/23/2017 0815   ALBUMIN 4.5 07/19/2017 0949   ALBUMIN 4.4 04/23/2017 0815   AST 16 07/19/2017 0949   ALT 23 07/19/2017 0949   ALKPHOS 70 07/19/2017 0949   BILITOT 1.5 (H) 07/19/2017 0949   BILITOT 0.8 04/23/2017 0815   GFRNONAA 96 04/23/2017 0815   GFRAA 111 04/23/2017 0815   Lab Results  Component Value Date   WBC 4.2 05/16/2017   HGB 16.4 05/16/2017   HCT 46.5 05/16/2017   MCV 83 05/16/2017   PLT 205 05/16/2017    Imaging Studies: No results found.  Assessment and Plan:   Eddie Lopez is a 37 y.o. y/o male here for follow-up of heartburn, hepatic steatosis  -Heartburn Well-controlled Occasional breakthrough symptoms once or twice a week No dysphagia Continue Prilosec 20 mg daily Zantac liquid to recurrence of symptoms, therefore Prilosec was started in this patient Benefits of PPI use outweigh risks at this time In addition, he has a reported history of eosinophilic esophagitis when he had his procedure in [place]. (Risks of PPI use were discussed with patient including bone loss, C. Diff diarrhea, pneumonia, infections, CKD, electrolyte abnormalities.  If clinically possible based on symptoms, goal would be to maintain patient on the lowest dose possible, or discontinue the medication with institution of acid reflux lifestyle modifications over time. Pt. Verbalizes understanding and chooses to continue the  medication.) Patient educated extensively on acid reflux lifestyle modification, including buying a bed wedge, not eating 3 hrs before bedtime, diet modifications, and handout given for the same.    -Salmon colored mucosa on EGD No intestinal metaplasia or dysplasia on biopsies Can survey in 2 to 3 years by repeat EGD.  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 Grayland Ormond We will also try to obtain CT abdomen, given his hepatic steatosis, and splenomegaly.  Will forward results to Dr. Grayland Ormond  Dr [name]

## 2017-09-24 ENCOUNTER — Telehealth: Payer: Self-pay | Admitting: Gastroenterology

## 2017-09-24 NOTE — Telephone Encounter (Signed)
Eddie Lopez from [employer] on behave of [employer] notifying Dr.Tahiliani that the requesting is pending an alternative recommendation. For any further questions the number is [phone] and the account [id number]. FYI

## 2017-09-25 ENCOUNTER — Other Ambulatory Visit: Payer: Self-pay

## 2017-09-25 DIAGNOSIS — K760 Fatty (change of) liver, not elsewhere classified: Secondary | ICD-10-CM

## 2017-09-25 DIAGNOSIS — R945 Abnormal results of liver function studies: Secondary | ICD-10-CM

## 2017-09-25 NOTE — Telephone Encounter (Signed)
I informed pt of his insurance company requesting an MRI W/WO Contrast instead of CT that had been ordered for 09/29/27. They have approved the MRI and this was scheduled for 10/10/17 at 8:45 am at [facility] but pt is unable to do this day. Pt would like to contact his insurance company to see if somewhere else would be cheaper than [facility] and could we do that. I said yes but we of course would need the name, phone and fax number of the facility. Pt will check on this and call back tomorrow. For now we are leaving his appt for 10/10/17.

## 2017-09-27 ENCOUNTER — Ambulatory Visit: Payer: Managed Care, Other (non HMO)

## 2017-09-28 ENCOUNTER — Ambulatory Visit: Payer: Managed Care, Other (non HMO)

## 2017-10-01 ENCOUNTER — Telehealth: Payer: Self-pay | Admitting: Gastroenterology

## 2017-10-01 NOTE — Telephone Encounter (Signed)
Patient left a voice message that he would like to talk to you regarding a MRI. Please call

## 2017-10-04 NOTE — Telephone Encounter (Signed)
Pt left vm for [name] regarding a test she ordered for him please call

## 2017-10-08 NOTE — Telephone Encounter (Signed)
Pt calls and would like to cancel his MRI for 10/10/2017 at [facility] and per his insurance company pt found [facility], phone: [phone], to be of less cost. Left message to contact us.

## 2017-10-08 NOTE — Telephone Encounter (Signed)
Pt. Would like his MRI at [facility] instead of [facility] due to cost. Will contact them. See note from encounter of 09/24/17.

## 2017-10-08 NOTE — Telephone Encounter (Signed)
[facility] returned call. Order for MRI sent via fax: [phone], along with demographics and insurance information. Pt notified and will call them and schedule appt.

## 2017-10-10 ENCOUNTER — Ambulatory Visit: Admission: RE | Admit: 2017-10-10 | Payer: Managed Care, Other (non HMO) | Source: Ambulatory Visit

## 2017-10-12 ENCOUNTER — Encounter: Payer: Self-pay | Admitting: Gastroenterology

## 2017-11-02 ENCOUNTER — Telehealth: Payer: Self-pay

## 2017-11-02 NOTE — Telephone Encounter (Signed)
-----   Message from Virgel Manifold, MD sent at 10/31/2017  1:24 PM EDT ----- MRI showed normal spleen. Fatty liver that we already know of. Please let patient know

## 2017-11-02 NOTE — Telephone Encounter (Signed)
Left message that MRI showed a normal spleen and fatty liver. Contact office for any questions.

## 2017-12-10 ENCOUNTER — Ambulatory Visit: Payer: Self-pay | Admitting: Gastroenterology

## 2017-12-22 ENCOUNTER — Other Ambulatory Visit: Payer: Self-pay | Admitting: Gastroenterology

## 2018-01-10 ENCOUNTER — Encounter: Payer: Self-pay | Admitting: Gastroenterology

## 2018-01-10 ENCOUNTER — Ambulatory Visit: Payer: Managed Care, Other (non HMO) | Admitting: Gastroenterology

## 2018-01-10 VITALS — BP 108/69 | HR 59 | Wt 259.0 lb

## 2018-01-10 DIAGNOSIS — K219 Gastro-esophageal reflux disease without esophagitis: Secondary | ICD-10-CM | POA: Diagnosis not present

## 2018-01-10 MED ORDER — OMEPRAZOLE 20 MG PO CPDR: 20.0000 mg | DELAYED_RELEASE_CAPSULE | Freq: Every day | ORAL | 1 refills | Status: DC

## 2018-01-10 NOTE — Progress Notes (Signed)
[name], MD [address]  [place]  [place], [place] [address]  Main: [phone]  Fax: [phone]   Primary Care Physician: [name], MD  Primary Gastroenterologist:  Dr. [name]  Chief complaint: Follow-up for indigestion, Barrett's  HPI: Eddie Lopez is a 37 y.o. male with history of GERD, questionable history of eosinophilic esophagitis, Barrett's esophagus, currently on Prilosec 20 mg daily, and Pepcid at that time with symptoms well controlled.  Also reports 1-2 episodes of breakthrough symptoms once or twice a week.  No weight loss.  No dysphagia.  No abdominal pain.  No altered bowel habits.  No melena or hematochezia.  Previous history: On previous visits, we also noted that his May 2018 labs showed mildly elevated bilirubin chronically. Liver work-up, and abdominal ultrasound were ordered.  Hepatic steatosis was reported.  Blood work was negative for viral and autoimmune hepatitis.  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  The ultrasound also reported splenomegaly with spleen measuring 14.8 cm.  He was referred to hematology by his primary care provider.  And they have ordered work-up which has been unrevealing.  Dr. Grayland Ormond, wanted to obtain a CT scan of his abdomen, but was not approved by insurance.  An MRI was done instead and it is available under imaging, 10/12/2017, "ultrasound outside films body" and reports a normal spleen.  His EGD on March 09, 2017, showed endoscopic features of eosinophilic esophagitis. Pathology report did not show any eosinophils in the distal or proximal esophagus, and reported changes compatible with reflux. Stomach biopsies showed mild chronic gastritis, negative for H. pylori. A small island of salmon colored mucosa and biopsies showed reflux gastroesophagitis, negative for goblet cells, dysplasia and malignancy.  We have tried  sending record release to his [employer] where the EGD was done, but have not received any records from them despite multiple attempts.  As per previous notes: Reportedly had an EGD 4-5 yrs ago for acid reflux.He did not have any symptoms of food impaction at that time or now. EGD reportedly showed eosinophilic esophagitis at the time. He did not have any reports or pathology reports from this procedure. He was started on PPI at that time and then transitioned over to a swallowed steroid. He states the PPI helped with the swallowed steroid did not. Over time he stopped taking both medications. In June or July of this year his heartburn returned and his primary care provider prescribed Prilosec 40 mg daily in the morning. This has helped his daytime symptoms but he is continuing to have heartburn at night.  Current Outpatient Medications  Medication Sig Dispense Refill  . BYSTOLIC 2.5 MG tablet   0  . famotidine (PEPCID) 20 MG tablet Take 20 mg by mouth at bedtime.    Marland Kitchen omeprazole (PRILOSEC) 20 MG capsule TAKE ONE CAPSULE( 20 MG TOTAL) BY MOUTH DAILY 30 capsule 0   No current facility-administered medications for this visit.     Allergies as of 01/10/2018  . (No Known Allergies)    ROS:  General: Negative for anorexia, weight loss, fever, chills, fatigue, weakness. ENT: Negative for hoarseness, difficulty swallowing , nasal congestion. CV: Negative for chest pain, angina, palpitations, dyspnea on exertion, peripheral edema.  Respiratory: Negative for dyspnea at rest, dyspnea on exertion, cough, sputum, wheezing.  GI: See history of present illness. GU:  Negative for dysuria, hematuria, urinary incontinence, urinary frequency, nocturnal urination.  Endo: Negative for unusual weight change.  Physical Examination:   BP 108/69   Pulse (!) 59   Wt 259 lb (117.5 kg)   BMI 34.17 kg/m   General: Well-nourished, well-developed in no acute distress.  Eyes: No icterus.  Conjunctivae pink. Mouth: Oropharyngeal mucosa moist and pink , no lesions erythema or exudate. Neck: Supple, Trachea midline Abdomen: Bowel sounds are normal, nontender, nondistended, no hepatosplenomegaly or masses, no abdominal bruits or hernia , no rebound or guarding.   Extremities: No lower extremity edema. No clubbing or deformities. Neuro: Alert and oriented x 3.  Grossly intact. Skin: Warm and dry, no jaundice.   Psych: Alert and cooperative, normal mood and affect.   Labs: CMP     Component Value Date/Time   NA 136 07/19/2017 0949   NA 142 04/23/2017 0815   K 4.0 07/19/2017 0949   CL 99 07/19/2017 0949   CO2 29 07/19/2017 0949   GLUCOSE 99 07/19/2017 0949   BUN 12 07/19/2017 0949   BUN 13 04/23/2017 0815   CREATININE 0.95 07/19/2017 0949   CALCIUM 10.0 07/19/2017 0949   PROT 7.6 07/19/2017 0949   PROT 7.1 04/23/2017 0815   ALBUMIN 4.5 07/19/2017 0949   ALBUMIN 4.4 04/23/2017 0815   AST 16 07/19/2017 0949   ALT 23 07/19/2017 0949   ALKPHOS 70 07/19/2017 0949   BILITOT 1.5 (H) 07/19/2017 0949   BILITOT 0.8 04/23/2017 0815   GFRNONAA 96 04/23/2017 0815   GFRAA 111 04/23/2017 0815   Lab Results  Component Value Date   WBC 4.2 05/16/2017   HGB 16.4 05/16/2017   HCT 46.5 05/16/2017   MCV 83 05/16/2017   PLT 205 05/16/2017    Imaging Studies: No results found.  Assessment and Plan:   Eddie Lopez is a 37 y.o. y/o male with history of heartburn, hepatic steatosis, here for follow-up  Heartburn well controlled with occasional breakthrough symptoms Patient educated extensively on acid reflux lifestyle modification, including buying a bed wedge, not eating 3 hrs before bedtime, diet modifications, and handout given for the same.  Patient would like to continue his Prilosec 20 mg daily and Pepcid ad bedtime as without them he has significant symptoms (Risks of PPI use were discussed with patient including bone loss, C. Diff diarrhea, pneumonia, infections,  CKD, electrolyte abnormalities.  If clinically possible based on symptoms, goal would be to maintain patient on the lowest dose possible, or discontinue the medication with institution of acid reflux lifestyle modifications over time. Pt. Verbalizes understanding and chooses to continue the medication.)  Salmon-colored mucosa seen on EGD No intestinal metaplasia or dysplasia seen on biopsies Surveillance discussed as salmon-colored mucosa by itself can represent Barrett's and patient is agreeable to repeating EGD in 2 to 3 years  Hepatic steatosis Finding of fatty liver on imaging discussed with patient Diet, weight loss, and exercise encouraged along with avoiding hepatotoxic drugs including alcohol Risk of progression to cirrhosis if above measures are not instituted were discussed as well, and patient verbalized understanding  Splenomegaly Order abdominal ultrasound Seen by Dr. 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

## 2018-01-10 NOTE — Addendum Note (Signed)
Addended by: [name] on: 01/10/2018 03:14 PM   Modules accepted: Orders

## 2018-01-11 ENCOUNTER — Other Ambulatory Visit: Payer: Self-pay

## 2018-01-11 MED ORDER — OMEPRAZOLE 20 MG PO CPDR: 20.0000 mg | DELAYED_RELEASE_CAPSULE | Freq: Every day | ORAL | 1 refills | Status: DC

## 2018-07-31 ENCOUNTER — Ambulatory Visit (INDEPENDENT_AMBULATORY_CARE_PROVIDER_SITE_OTHER): Payer: Managed Care, Other (non HMO) | Admitting: Gastroenterology

## 2018-07-31 ENCOUNTER — Encounter: Payer: Self-pay | Admitting: Gastroenterology

## 2018-07-31 DIAGNOSIS — K219 Gastro-esophageal reflux disease without esophagitis: Secondary | ICD-10-CM

## 2018-07-31 MED ORDER — OMEPRAZOLE 20 MG PO CPDR: 20.0000 mg | DELAYED_RELEASE_CAPSULE | Freq: Two times a day (BID) | ORAL | 1 refills | Status: DC

## 2018-07-31 NOTE — Progress Notes (Signed)
Eddie Antigua, MD [address]  [place]  [place], [place] [address]  Main: [phone]  Fax: [phone]   Primary Care Physician: Eddie Haven, MD  Virtual Visit via Telephone Note  I connected with patient on 07/31/18 at  9:00 AM EDT by telephone and verified that I am speaking with the correct person using two identifiers.   I discussed the limitations, risks, security and privacy concerns of performing an evaluation and management service by telephone and the availability of in person appointments. I also discussed with the patient that there may be a patient responsible charge related to this service. The patient expressed understanding and agreed to proceed.  Location of Patient: Home Location of Provider: Home Persons involved: Patient and provider only   History of Present Illness: Chief Complaint  Patient presents with  . Gastroesophageal Reflux    breakthrough     HPI: Eddie Lopez is a 38 y.o. male with history of GERD here for follow up. Pt is taking Prilosec 20 mg every morning and Pepcid at bedtime. Pt reports breakthrough symptoms every night for the past few weeks. He does not eat 3 hrs before bedtime.  Has been using a bed wedge, and sleeping on his left side but symptoms continue.  No dysphagia.  No weight loss.  No abdominal pain.  No nausea or vomiting.  No altered bowel habits.  No melena or hematochezia.  Previous history: On previous visits, we also noted that his May 2018 labs showed mildly elevated bilirubin chronically. Liver work-up, and abdominal ultrasound were ordered. Hepatic steatosis was reported.Blood work was negative for viral and autoimmune hepatitis. 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  The ultrasound also reported splenomegaly with spleen measuring 14.8 cm. He was referred to hematology by his primary care provider. And they have ordered  work-up which has been unrevealing. Dr. Grayland Lopez, wanted to obtain a CT scan of his abdomen, but was not approved by insurance.  An MRI was done instead and it is available under imaging, 10/12/2017, "ultrasound outside films body" and reports a normal spleen.  His EGD on March 09, 2017, showed endoscopic features of eosinophilic esophagitis. Pathology report did not show any eosinophils in the distal or proximal esophagus, and reported changes compatible with reflux. Stomach biopsies showed mild chronic gastritis, negative for H. pylori. A small island of salmon colored mucosa and biopsies showed reflux gastroesophagitis, negative for goblet cells, dysplasia and malignancy.  We have tried sending record release to his [employer] where the EGD was done, but have not received any records from them despite multiple attempts.  As per previous notes: Reportedly had an EGD 4-5 yrs ago for acid reflux.He did not have any symptoms of food impaction at that time or now. EGD reportedly showed eosinophilic esophagitis at the time. He did not have any reports or pathology reports from this procedure. He was started on PPI at that time and then transitioned over to a swallowed steroid. He states the PPI helped with the swallowed steroid did not. Over time he stopped taking both medications. In June or July of this year his heartburn returned and his primary care provider prescribed Prilosec 40 mg daily in the morning. This has helped his daytime symptoms but he is continuing to have heartburn at night.  Current Outpatient Medications  Medication Sig Dispense Refill  . nebivolol (BYSTOLIC) 2.5 MG tablet Take by mouth.    Marland Kitchen omeprazole (PRILOSEC)  20 MG capsule Take 1 capsule (20 mg total) by mouth 2 (two) times a day for 30 days. 60 capsule 1   No current facility-administered medications for this visit.     Allergies as of 07/31/2018  . (No Known Allergies)    Review of Systems:     All systems reviewed and negative except where noted in HPI.   Observations/Objective:  Labs: CMP     Component Value Date/Time   NA 136 07/19/2017 0949   NA 142 04/23/2017 0815   K 4.0 07/19/2017 0949   CL 99 07/19/2017 0949   CO2 29 07/19/2017 0949   GLUCOSE 99 07/19/2017 0949   BUN 12 07/19/2017 0949   BUN 13 04/23/2017 0815   CREATININE 0.95 07/19/2017 0949   CALCIUM 10.0 07/19/2017 0949   PROT 7.6 07/19/2017 0949   PROT 7.1 04/23/2017 0815   ALBUMIN 4.5 07/19/2017 0949   ALBUMIN 4.4 04/23/2017 0815   AST 16 07/19/2017 0949   ALT 23 07/19/2017 0949   ALKPHOS 70 07/19/2017 0949   BILITOT 1.5 (H) 07/19/2017 0949   BILITOT 0.8 04/23/2017 0815   GFRNONAA 96 04/23/2017 0815   GFRAA 111 04/23/2017 0815   Lab Results  Component Value Date   WBC 4.2 05/16/2017   HGB 16.4 05/16/2017   HCT 46.5 05/16/2017   MCV 83 05/16/2017   PLT 205 05/16/2017    Imaging Studies: No results found.  Assessment and Plan:   Eddie Lopez is a 38 y.o. y/o male with GERD, hepatic steatosis, previous questionable history of EOE based on an upper endoscopy in [place] that we do not have records of with no previous history of food impactions  Assessment and Plan: Despite appropriate lifestyle changes patient is continuing to have daily breakthrough symptoms for the past few weeks  Therefore, it is reasonable to increase his Prilosec to 20 mg twice daily as symptoms are more significant at nighttime and wakes him up from sleeping.  Will increase dose for 2 months and then decrease back again to once daily  (Risks of PPI use were discussed with patient including bone loss, C. Diff diarrhea, pneumonia, infections, CKD, electrolyte abnormalities.  If clinically possible based on symptoms, goal would be to maintain patient on the lowest dose possible, or discontinue the medication with institution of acid reflux lifestyle modifications over time. Pt. Verbalizes understanding and chooses to  continue the medication.)  In addition, there was a salmon-colored mucosa islands seen on his upper endoscopy and biopsies did not show Barrett's.  Barrett's can be present even though biopsies are negative as sampling of the area can cause the biopsies to be negative.  Therefore it is important to prevent worsening of potential underlying Barrett's in this area.  Repeat EGD in 2 to 3 years from January 2019  Hepatic steatosis Finding of fatty liver on imaging discussed with patient Diet, weight loss, and exercise encouraged along with avoiding hepatotoxic drugs including alcohol Risk of progression to cirrhosis if above measures are not instituted were discussed as well, and patient verbalized understanding  Splenomegaly Seen by Dr. 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 The patient was provided an opportunity to ask questions and all were answered. The patient agreed with the plan and demonstrated an understanding of the instructions.   The patient was advised  to call back or seek an in-person evaluation if the symptoms worsen or if the condition fails to improve as anticipated.  I provided 15 minutes of non-face-to-face time during this encounter.   Virgel Manifold, MD  Speech recognition software was used to dictate this note.

## 2018-09-06 ENCOUNTER — Encounter: Payer: Self-pay | Admitting: Family Medicine

## 2018-09-25 ENCOUNTER — Other Ambulatory Visit: Payer: Self-pay

## 2018-09-25 MED ORDER — OMEPRAZOLE 20 MG PO CPDR: 20.0000 mg | DELAYED_RELEASE_CAPSULE | Freq: Two times a day (BID) | ORAL | 3 refills | Status: DC

## 2018-10-03 ENCOUNTER — Other Ambulatory Visit: Payer: Self-pay

## 2018-10-04 ENCOUNTER — Encounter: Payer: Self-pay | Admitting: Family Medicine

## 2018-10-04 ENCOUNTER — Ambulatory Visit (INDEPENDENT_AMBULATORY_CARE_PROVIDER_SITE_OTHER): Payer: Managed Care, Other (non HMO) | Admitting: Family Medicine

## 2018-10-04 VITALS — BP 120/80 | HR 55 | Temp 99.1°F | Ht 73.0 in | Wt 250.4 lb

## 2018-10-04 DIAGNOSIS — Z0000 Encounter for general adult medical examination without abnormal findings: Secondary | ICD-10-CM | POA: Diagnosis not present

## 2018-10-04 DIAGNOSIS — Z13220 Encounter for screening for lipoid disorders: Secondary | ICD-10-CM

## 2018-10-04 DIAGNOSIS — D229 Melanocytic nevi, unspecified: Secondary | ICD-10-CM | POA: Diagnosis not present

## 2018-10-04 DIAGNOSIS — E669 Obesity, unspecified: Secondary | ICD-10-CM

## 2018-10-04 NOTE — Patient Instructions (Signed)
Nice to see you. We will check lab work today and contact you with the results. Please try to increase her walking. Please try to increase vegetables in your diet. We will refer you to dermatology.

## 2018-10-04 NOTE — Progress Notes (Signed)
Eddie Rumps, MD Phone: [phone]  Eddie Lopez is a 38 y.o. male who presents today for CPE.  Diet: Notes this is not the best.  He needs to get more vegetables.  Sweet tea on the weekend.  Dark chocolate is the only junk food he has. Exercise: Has been walking some for exercise 2 to 3 days a week for 15 to 20 minutes at a time.  He is doing under desk cycle as well during the day. No family history of prostate cancer or colon cancer. Tetanus vaccine up-to-date. HIV screening up-to-date. No tobacco use or illicit drug use.  Less than 1 alcoholic beverage a week. Sees a dentist and ophthalmologist. He is sexually active with his wife. 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  Overall it is relatively unchanged though it has become slightly raised over the last 6 to 12 months.  Active Ambulatory Problems    Diagnosis Date Noted  . PVC (premature ventricular contraction) 12/20/2010  . Complicated migraine 28/78/6767  . GERD (gastroesophageal reflux disease) 08/18/2016  . Esophagitis, unspecified   . Stomach irritation   . Columnar epithelial-lined lower esophagus   . Splenomegaly 05/20/2017  . Routine general medical examination at a health care facility 07/19/2017  . Nevus 10/04/2018   Resolved Ambulatory Problems    Diagnosis Date Noted  . No Resolved Ambulatory Problems   Past Medical History:  Diagnosis Date  . Palpitations     Family History  Problem Relation Age of Onset  . Throat cancer Maternal Grandfather   . Cancer Maternal Grandmother   . Cancer Paternal Grandmother     Social History   Socioeconomic History  . Marital status: Married    Spouse name: Not on file  . Number of children: Not on file  . Years of education: Not on file  . Highest education level: Not on file  Occupational History  . Not on file  Social Needs  . Financial resource strain: Not on file  . Food insecurity    Worry: Not on file     Inability: Not on file  . Transportation needs    Medical: Not on file    Non-medical: Not on file  Tobacco Use  . Smoking status: Never Smoker  . Smokeless tobacco: Never Used  Substance and Sexual Activity  . Alcohol use: Yes    Alcohol/week: 0.0 standard drinks    Comment: 1 beer a week at most  . Drug use: No  . Sexual activity: Yes  Lifestyle  . Physical activity    Days per week: Not on file    Minutes per session: Not on file  . Stress: Not on file  Relationships  . Social Herbalist on phone: Not on file    Gets together: Not on file    Attends religious service: Not on file    Active member of club or organization: Not on file    Attends meetings of clubs or organizations: Not on file    Relationship status: Not on file  . Intimate partner violence    Fear of current or ex partner: Not on file    Emotionally abused: Not on file    Physically abused: Not on file    Forced sexual activity: Not on file  Other Topics Concern  . Not on file  Social History Narrative  . Not on file    ROS  General:  Negative for nexplained  weight loss, fever Skin: Positive for changing mole, negative for sore that won't heal HEENT: Negative for trouble hearing, trouble seeing, ringing in ears, mouth sores, hoarseness, change in voice, dysphagia. CV:  Negative for chest pain, dyspnea, edema, palpitations Resp: Negative for cough, dyspnea, hemoptysis GI: Negative for nausea, vomiting, diarrhea, constipation, abdominal pain, melena, hematochezia. GU: Negative for dysuria, incontinence, urinary hesitance, hematuria, vaginal or penile discharge, polyuria, sexual difficulty, lumps in testicle or breasts MSK: Negative for muscle cramps or aches, joint pain or swelling Neuro: Negative for headaches, weakness, numbness, dizziness, passing out/fainting Psych: Negative for depression, anxiety, memory problems  Objective  Physical Exam Vitals:   10/04/18 1344  BP: 120/80   Pulse: (!) 55  Temp: 99.1 F (37.3 C)  SpO2: 98%    BP Readings from Last 3 Encounters:  10/04/18 120/80  01/10/18 108/69  09/04/17 117/80   Wt Readings from Last 3 Encounters:  10/04/18 250 lb 6.4 oz (113.6 kg)  01/10/18 259 lb (117.5 kg)  09/04/17 256 lb (116.1 kg)    Physical Exam Constitutional:      General: He is not in acute distress.    Appearance: He is not diaphoretic.  HENT:     Head: Normocephalic and atraumatic.  Eyes:     Conjunctiva/sclera: Conjunctivae normal.     Pupils: Pupils are equal, round, and reactive to light.  Cardiovascular:     Rate and Rhythm: Normal rate and regular rhythm.     Heart sounds: Normal heart sounds.  Pulmonary:     Effort: Pulmonary effort is normal.     Breath sounds: Normal breath sounds.  Abdominal:     General: Bowel sounds are normal. There is no distension.     Palpations: Abdomen is soft. There is no mass.     Tenderness: There is no abdominal tenderness. There is no guarding or rebound.  Musculoskeletal:     Right lower leg: No edema.     Left lower leg: No edema.  Lymphadenopathy:     Cervical: No cervical adenopathy.  Skin:    General: Skin is warm and dry.  Neurological:     Mental Status: He is alert.  Psychiatric:        Mood and Affect: Mood normal.     Right forearm skin lesion   Assessment/Plan:   Routine general medical examination at a health care facility Physical exam completed.  Encouraged increasing vegetables and increasing exercise.  Vaccines up-to-date.  Wellness screening form will be filled out once labs have been completed.  Lab work as outlined below.  Nevus Benign appearance though this has changed over the last year or so.  Refer to dermatology.   Orders Placed This Encounter  Procedures  . HgB A1c  . Comp Met (CMET)  . Lipid panel  . Ambulatory referral to Dermatology    Referral Priority:   Routine    Referral Type:   Consultation    Referral Reason:   Specialty Services  Required    Requested Specialty:   Dermatology    Number of Visits Requested:   1    No orders of the defined types were placed in this encounter.    Eddie Rumps, MD [place]

## 2018-10-04 NOTE — Assessment & Plan Note (Signed)
Benign appearance though this has changed over the last year or so.  Refer to dermatology.

## 2018-10-04 NOTE — Assessment & Plan Note (Signed)
Physical exam completed.  Encouraged increasing vegetables and increasing exercise.  Vaccines up-to-date.  Wellness screening form will be filled out once labs have been completed.  Lab work as outlined below.

## 2018-10-05 LAB — COMPREHENSIVE METABOLIC PANEL
AG Ratio: 1.9 (calc) (ref 1.0–2.5)
ALT: 32 U/L (ref 9–46)
AST: 23 U/L (ref 10–40)
Albumin: 4.6 g/dL (ref 3.6–5.1)
Alkaline phosphatase (APISO): 72 U/L (ref 36–130)
BUN: 9 mg/dL (ref 7–25)
CO2: 26 mmol/L (ref 20–32)
Calcium: 9.9 mg/dL (ref 8.6–10.3)
Chloride: 103 mmol/L (ref 98–110)
Creat: 0.85 mg/dL (ref 0.60–1.35)
Globulin: 2.4 g/dL (calc) (ref 1.9–3.7)
Glucose, Bld: 86 mg/dL (ref 65–99)
Potassium: 3.9 mmol/L (ref 3.5–5.3)
Sodium: 137 mmol/L (ref 135–146)
Total Bilirubin: 2.4 mg/dL — ABNORMAL HIGH (ref 0.2–1.2)
Total Protein: 7 g/dL (ref 6.1–8.1)

## 2018-10-05 LAB — HEMOGLOBIN A1C
Hgb A1c MFr Bld: 5.1 % of total Hgb (ref <5.7)
Mean Plasma Glucose: 100 (calc)
eAG (mmol/L): 5.5 (calc)

## 2018-10-05 LAB — LIPID PANEL
Cholesterol: 180 mg/dL (ref <200)
HDL: 38 mg/dL — ABNORMAL LOW (ref >=40)
LDL Cholesterol (Calc): 117 mg/dL (calc) — ABNORMAL HIGH
Non-HDL Cholesterol (Calc): 142 mg/dL (calc) — ABNORMAL HIGH (ref <130)
Total CHOL/HDL Ratio: 4.7 (calc) (ref <5.0)
Triglycerides: 136 mg/dL (ref <150)

## 2018-10-06 ENCOUNTER — Other Ambulatory Visit: Payer: Self-pay | Admitting: Family Medicine

## 2018-10-06 DIAGNOSIS — R17 Unspecified jaundice: Secondary | ICD-10-CM

## 2019-03-10 ENCOUNTER — Other Ambulatory Visit: Payer: Self-pay

## 2019-03-10 MED ORDER — OMEPRAZOLE 20 MG PO CPDR: 20.0000 mg | DELAYED_RELEASE_CAPSULE | Freq: Every day | ORAL | 0 refills | Status: DC

## 2019-03-10 NOTE — Telephone Encounter (Signed)
Last office visit 07/31/2018 GERD  Last refill 09/25/2018 3 refills  No appointment is scheduled

## 2019-06-04 ENCOUNTER — Other Ambulatory Visit: Payer: Self-pay

## 2019-06-04 DIAGNOSIS — R12 Heartburn: Secondary | ICD-10-CM

## 2019-06-04 MED ORDER — OMEPRAZOLE 20 MG PO CPDR: 20.0000 mg | DELAYED_RELEASE_CAPSULE | Freq: Every day | ORAL | 0 refills | Status: DC

## 2019-10-13 ENCOUNTER — Other Ambulatory Visit: Payer: Self-pay

## 2019-10-13 ENCOUNTER — Encounter: Payer: Self-pay | Admitting: Family Medicine

## 2019-10-13 ENCOUNTER — Ambulatory Visit (INDEPENDENT_AMBULATORY_CARE_PROVIDER_SITE_OTHER): Payer: Managed Care, Other (non HMO) | Admitting: Family Medicine

## 2019-10-13 VITALS — BP 122/80 | HR 62 | Temp 98.3°F | Ht 73.0 in | Wt 270.0 lb

## 2019-10-13 DIAGNOSIS — Z1152 Encounter for screening for COVID-19: Secondary | ICD-10-CM | POA: Diagnosis not present

## 2019-10-13 DIAGNOSIS — Z0000 Encounter for general adult medical examination without abnormal findings: Secondary | ICD-10-CM

## 2019-10-13 DIAGNOSIS — E669 Obesity, unspecified: Secondary | ICD-10-CM | POA: Diagnosis not present

## 2019-10-13 LAB — HEPATIC FUNCTION PANEL
ALT: 56 U/L — ABNORMAL HIGH (ref 0–53)
AST: 32 U/L (ref 0–37)
Albumin: 4.4 g/dL (ref 3.5–5.2)
Alkaline Phosphatase: 83 U/L (ref 39–117)
Bilirubin, Direct: 0.2 mg/dL (ref 0.0–0.3)
Total Bilirubin: 1.2 mg/dL (ref 0.2–1.2)
Total Protein: 7.2 g/dL (ref 6.0–8.3)

## 2019-10-13 LAB — LIPID PANEL
Cholesterol: 179 mg/dL (ref 0–200)
HDL: 38.4 mg/dL — ABNORMAL LOW (ref >39.00)
LDL Cholesterol: 110 mg/dL — ABNORMAL HIGH (ref 0–99)
NonHDL: 140.89
Total CHOL/HDL Ratio: 5
Triglycerides: 153 mg/dL — ABNORMAL HIGH (ref 0.0–149.0)
VLDL: 30.6 mg/dL (ref 0.0–40.0)

## 2019-10-13 LAB — BASIC METABOLIC PANEL
BUN: 13 mg/dL (ref 6–23)
CO2: 26 mEq/L (ref 19–32)
Calcium: 9.9 mg/dL (ref 8.4–10.5)
Chloride: 102 mEq/L (ref 96–112)
Creatinine, Ser: 1.01 mg/dL (ref 0.40–1.50)
GFR: 81.69 mL/min (ref >60.00)
Glucose, Bld: 94 mg/dL (ref 70–99)
Potassium: 4.3 mEq/L (ref 3.5–5.1)
Sodium: 138 mEq/L (ref 135–145)

## 2019-10-13 LAB — SARS-COV-2 IGG: SARS-COV-2 IgG: 6.3

## 2019-10-13 LAB — HEMOGLOBIN A1C: Hgb A1c MFr Bld: 5.1 % (ref 4.6–6.5)

## 2019-10-13 NOTE — Progress Notes (Signed)
Eddie Rumps, MD Phone: [phone]  Eddie Lopez is a 39 y.o. male who presents today for CPE.  Diet: Patient notes he eats a mix of healthy and unhealthy foods.  Nothing very bad.  No soda.  He drinks 1 glass of sweet tea daily.  Not much fried foods.  Eats chocolate most days. Exercise: Not doing much. Family history-  Prostate cancer: No  Colon cancer: No Vaccines-   Flu: Patient will get this when it becomes available  Tetanus: UTD  COVID19: UTD HIV screening: UTD Hep C Screening: UTD Tobacco use: No Alcohol use: Less than 1 a week Illicit Drug use: No Dentist: Yes Ophthalmology: Yes Patient does note some intermittent diarrhea over the last couple of years.  Frequency had increased though recently has decreased to being very rare.  He has an appointment with GI.  He is not tied at any foods.  He will have some cramping with it.  Notes one episode at a time and then does not recur for weeks or months.    Active Ambulatory Problems    Diagnosis Date Noted  . PVC (premature ventricular contraction) 12/20/2010  . Complicated migraine 02/58/5277  . GERD (gastroesophageal reflux disease) 08/18/2016  . Esophagitis, unspecified   . Stomach irritation   . Columnar epithelial-lined lower esophagus   . Splenomegaly 05/20/2017  . Routine general medical examination at a health care facility 07/19/2017  . Nevus 10/04/2018  . 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 Obesity (BMI 35.0-39.9 without comorbidity) 10/13/2019   Resolved Ambulatory Problems    Diagnosis Date Noted  . No Resolved Ambulatory Problems   Past Medical History:  Diagnosis Date  . Palpitations     Family History  Problem Relation Age of Onset  . Throat cancer Maternal Grandfather   . Cancer Maternal Grandmother   . Cancer Paternal Grandmother     Social History   Socioeconomic History  . Marital status: Married    Spouse name: Not on file  . Number of children: Not on file  . Years of  education: Not on file  . Highest education level: Not on file  Occupational History  . Not on file  Tobacco Use  . Smoking status: Never Smoker  . Smokeless tobacco: Never Used  Vaping Use  . Vaping Use: Never used  Substance and Sexual Activity  . Alcohol use: Yes    Alcohol/week: 0.0 standard drinks    Comment: 1 beer a week at most  . Drug use: No  . Sexual activity: Yes  Other Topics Concern  . Not on file  Social History Narrative  . Not on file   Social Determinants of Health   Financial Resource Strain:   . Difficulty of Paying Living Expenses:   Food Insecurity:   . Worried About Charity fundraiser in the Last Year:   . Arboriculturist in the Last Year:   Transportation Needs:   . Film/video editor (Medical):   Marland Kitchen Lack of Transportation (Non-Medical):   Physical Activity:   . Days of Exercise per Week:   . Minutes of Exercise per Session:   Stress:   . Feeling of Stress :   Social Connections:   . Frequency of Communication with Friends and Family:   . Frequency of Social Gatherings with Friends and Family:   . Attends Religious Services:   . Active Member of Clubs or Organizations:   . Attends Archivist Meetings:   Marland Kitchen Marital Status:  Intimate Partner Violence:   . Fear of Current or Ex-Partner:   . Emotionally Abused:   Marland Kitchen Physically Abused:   . Sexually Abused:     ROS  General:  Negative for nexplained weight loss, fever Skin: Negative for new or changing mole, sore that won't heal HEENT: Negative for trouble hearing, trouble seeing, ringing in ears, mouth sores, hoarseness, change in voice, dysphagia. CV:  Negative for chest pain, dyspnea, edema, palpitations Resp: Negative for cough, dyspnea, hemoptysis GI: Positive for diarrhea, negative for nausea, vomiting, constipation, abdominal pain, melena, hematochezia. GU: Negative for dysuria, incontinence, urinary hesitance, hematuria, vaginal or penile discharge, polyuria, sexual  difficulty, lumps in testicle or breasts MSK: Negative for muscle cramps or aches, joint pain or swelling Neuro: Negative for headaches, weakness, numbness, dizziness, passing out/fainting Psych: Negative for depression, anxiety, memory problems  Objective  Physical Exam Vitals:   10/13/19 0825 10/13/19 0845  BP: 124/86 122/80  Pulse: 62   Temp: 98.3 F (36.8 C)   SpO2: 98%     BP Readings from Last 3 Encounters:  10/13/19 122/80  10/04/18 120/80  01/10/18 108/69   Wt Readings from Last 3 Encounters:  10/13/19 270 lb (122.5 kg)  10/04/18 250 lb 6.4 oz (113.6 kg)  01/10/18 259 lb (117.5 kg)    Physical Exam Constitutional:      General: He is not in acute distress.    Appearance: He is not diaphoretic.  HENT:     Head: Normocephalic and atraumatic.  Eyes:     Conjunctiva/sclera: Conjunctivae normal.     Pupils: Pupils are equal, round, and reactive to light.  Cardiovascular:     Rate and Rhythm: Normal rate and regular rhythm.     Heart sounds: Normal heart sounds.  Pulmonary:     Effort: Pulmonary effort is normal.     Breath sounds: Normal breath sounds.  Abdominal:     General: Bowel sounds are normal. There is no distension.     Palpations: Abdomen is soft.     Tenderness: There is no abdominal tenderness. There is no guarding or rebound.  Musculoskeletal:     Right lower leg: No edema.     Left lower leg: No edema.  Skin:    General: Skin is warm and dry.  Neurological:     Mental Status: He is alert.  Psychiatric:        Mood and Affect: Mood normal.      Assessment/Plan:   Routine general medical examination at a health care facility Physical exam completed.  Encouraged adding in walking or weight training 2 days a week on a schedule.  Discussed healthier diet.  Discussed decreasing sweet tea and chocolate intake.  He will get his flu vaccine when it becomes available.  He will see GI for the diarrhea.  Given FODMAP diet to see if that would make a  difference.  Lab work as outlined below.  Patient request Covid antibody testing.  He notes no prior illness.  He has been vaccinated.   Orders Placed This Encounter  Procedures  . Basic Metabolic Panel (BMET)  . Hepatic function panel  . HgB A1c  . SARS-COV-2 IgG  . Lipid panel    No orders of the defined types were placed in this encounter.   This visit occurred during the SARS-CoV-2 public health emergency.  Safety protocols were in place, including screening questions prior to the visit, additional usage of staff PPE, and extensive cleaning of exam room while  observing appropriate contact time as indicated for disinfecting solutions.    Eddie Rumps, MD [place]

## 2019-10-13 NOTE — Assessment & Plan Note (Addendum)
Physical exam completed.  Encouraged adding in walking or weight training 2 days a week on a schedule.  Discussed healthier diet.  Discussed decreasing sweet tea and chocolate intake.  He will get his flu vaccine when it becomes available.  He will see GI for the diarrhea.  Given FODMAP diet to see if that would make a difference.  Lab work as outlined below.  Patient request Covid antibody testing.  He notes no prior illness.  He has been vaccinated.

## 2019-10-13 NOTE — Patient Instructions (Signed)
Nice to see you. We will get lab work today. Please look at the list of foods when you have your diarrhea and see if anything triggers this. Please add in exercise and decrease your sweet tea and chocolate intake.   Low-FODMAP Eating Plan  FODMAPs (fermentable oligosaccharides, disaccharides, monosaccharides, and polyols) are sugars that are hard for some people to digest. A low-FODMAP eating plan may help some people who have bowel (intestinal) diseases to manage their symptoms. This meal plan can be complicated to follow. Work with a diet and nutrition specialist (dietitian) to make a low-FODMAP eating plan that is right for you. A dietitian can make sure that you get enough nutrition from this diet. What are tips for following this plan? Reading food labels  Check labels for hidden FODMAPs such as: ? High-fructose syrup. ? Honey. ? Agave. ? Natural fruit flavors. ? Onion or garlic powder.  Choose low-FODMAP foods that contain 3-4 grams of fiber per serving.  Check food labels for serving sizes. Eat only one serving at a time to make sure FODMAP levels stay low. Meal planning  Follow a low-FODMAP eating plan for up to 6 weeks, or as told by your health care provider or dietitian.  To follow the eating plan: 1. Eliminate high-FODMAP foods from your diet completely. 2. Gradually reintroduce high-FODMAP foods into your diet one at a time. Most people should wait a few days after introducing one high-FODMAP food before they introduce the next high-FODMAP food. Your dietitian can recommend how quickly you may reintroduce foods. 3. Keep a daily record of what you eat and drink, and make note of any symptoms that you have after eating. 4. Review your daily record with a dietitian regularly. Your dietitian can help you identify which foods you can eat and which foods you should avoid. General tips  Drink enough fluid each day to keep your urine pale yellow.  Avoid processed foods. These  often have added sugar and may be high in FODMAPs.  Avoid most dairy products, whole grains, and sweeteners.  Work with a dietitian to make sure you get enough fiber in your diet. Recommended foods Grains  Gluten-free grains, such as rice, oats, buckwheat, quinoa, corn, polenta, and millet. Gluten-free pasta, bread, or cereal. Rice noodles. Corn tortillas. Vegetables  Eggplant, zucchini, cucumber, peppers, green beans, Brussels sprouts, bean sprouts, lettuce, arugula, kale, Swiss chard, spinach, collard greens, bok choy, summer squash, potato, and tomato. Limited amounts of corn, carrot, and sweet potato. Green parts of scallions. Fruits  Bananas, oranges, lemons, limes, blueberries, raspberries, strawberries, grapes, cantaloupe, honeydew melon, kiwi, papaya, passion fruit, and pineapple. Limited amounts of dried cranberries, banana chips, and shredded coconut. Dairy  Lactose-free milk, yogurt, and kefir. Lactose-free cottage cheese and ice cream. Non-dairy milks, such as almond, coconut, hemp, and rice milk. Yogurts made of non-dairy milks. Limited amounts of goat cheese, brie, mozzarella, parmesan, swiss, and other hard cheeses. Meats and other protein foods  Unseasoned beef, pork, poultry, or fish. Eggs. Berniece Salines. Tofu (firm) and tempeh. Limited amounts of nuts and seeds, such as almonds, walnuts, [place] nuts, pecans, peanuts, pumpkin seeds, chia seeds, and sunflower seeds. Fats and oils  Butter-free spreads. Vegetable oils, such as olive, canola, and sunflower oil. Seasoning and other foods  Artificial sweeteners with names that do not end in "ol" such as aspartame, saccharine, and stevia. Maple syrup, white table sugar, raw sugar, brown sugar, and molasses. Fresh basil, coriander, parsley, rosemary, and thyme. Beverages  Water and mineral water.  Sugar-sweetened soft drinks. Small amounts of orange juice or cranberry juice. Black and green tea. Most dry wines. Coffee. This may not be a  complete list of low-FODMAP foods. Talk with your dietitian for more information. Foods to avoid Grains  Wheat, including kamut, durum, and semolina. Barley and bulgur. Couscous. Wheat-based cereals. Wheat noodles, bread, crackers, and pastries. Vegetables  Chicory root, artichoke, asparagus, cabbage, snow peas, sugar snap peas, mushrooms, and cauliflower. Onions, garlic, leeks, and the white part of scallions. Fruits  Fresh, dried, and juiced forms of apple, pear, watermelon, peach, plum, cherries, apricots, blackberries, boysenberries, figs, nectarines, and mango. Avocado. Dairy  Milk, yogurt, ice cream, and soft cheese. Cream and sour cream. Milk-based sauces. Custard. Meats and other protein foods  Fried or fatty meat. Sausage. Cashews and pistachios. Soybeans, baked beans, black beans, chickpeas, kidney beans, fava beans, navy beans, lentils, and split peas. Seasoning and other foods  Any sugar-free gum or candy. Foods that contain artificial sweeteners such as sorbitol, mannitol, isomalt, or xylitol. Foods that contain honey, high-fructose corn syrup, or agave. Bouillon, vegetable stock, beef stock, and chicken stock. Garlic and onion powder. Condiments made with onion, such as hummus, chutney, pickles, relish, salad dressing, and salsa. Tomato paste. Beverages  Chicory-based drinks. Coffee substitutes. Chamomile tea. Fennel tea. Sweet or fortified wines such as port or sherry. Diet soft drinks made with isomalt, mannitol, maltitol, sorbitol, or xylitol. Apple, pear, and mango juice. Juices with high-fructose corn syrup. This may not be a complete list of high-FODMAP foods. Talk with your dietitian to discuss what dietary choices are best for you.  Summary  A low-FODMAP eating plan is a short-term diet that eliminates FODMAPs from your diet to help ease symptoms of certain bowel diseases.  The eating plan usually lasts up to 6 weeks. After that, high-FODMAP foods are restarted  gradually, one at a time, so you can find out which may be causing symptoms.  A low-FODMAP eating plan can be complicated. It is best to work with a dietitian who has experience with this type of plan. This information is not intended to replace advice given to you by your health care provider. Make sure you discuss any questions you have with your health care provider. Document Revised: 02/02/2017 Document Reviewed: 10/17/2016 Elsevier Patient Education  [place].

## 2019-10-14 ENCOUNTER — Other Ambulatory Visit: Payer: Self-pay | Admitting: Family Medicine

## 2019-10-14 ENCOUNTER — Telehealth: Payer: Self-pay

## 2019-10-14 DIAGNOSIS — R7989 Other specified abnormal findings of blood chemistry: Secondary | ICD-10-CM

## 2019-10-14 NOTE — Telephone Encounter (Signed)
-----   Message from [name], MD sent at 10/14/2019  9:11 AM EDT ----- Please let the patient know one of his liver enzymes is minimally elevated.  I would like to recheck that in 3 to 4 weeks.  Order placed.  His LDL cholesterol is minimally elevated.  He does not meet criteria for treatment though he does need to monitor his diet and exercise.  His COVID-19 antibody test was reactive indicating that at some point he had an immune response to COVID-19.  The 10-year ASCVD risk score Mikey Bussing DC Brooke Bonito., et al., 2013) is: 1.4%   Values used to calculate the score:     Age: 39 years     Sex: Male     Is Non-Hispanic African American: No     Diabetic: No     Tobacco smoker: No     Systolic Blood Pressure: 996 mmHg     Is BP treated: Yes     HDL Cholesterol: 38.4 mg/dL     Total Cholesterol: 179 mg/dL

## 2019-10-14 NOTE — Telephone Encounter (Signed)
-----   Message from [name], MD sent at 10/14/2019  9:11 AM EDT ----- Please let the patient know one of his liver enzymes is minimally elevated.  I would like to recheck that in 3 to 4 weeks.  Order placed.  His LDL cholesterol is minimally elevated.  He does not meet criteria for treatment though he does need to monitor his diet and exercise.  His COVID-19 antibody test was reactive indicating that at some point he had an immune response to COVID-19.  The 10-year ASCVD risk score Mikey Bussing DC Brooke Bonito., et al., 2013) is: 1.4%   Values used to calculate the score:     Age: 39 years     Sex: Male     Is Non-Hispanic African American: No     Diabetic: No     Tobacco smoker: No     Systolic Blood Pressure: 060 mmHg     Is BP treated: Yes     HDL Cholesterol: 38.4 mg/dL     Total Cholesterol: 179 mg/dL

## 2019-10-17 NOTE — Telephone Encounter (Signed)
Pt came in to pick up form but it was not in folder up front. Unable to find form. Please let me know where I can find it and I will call him. Thanks!

## 2019-10-20 ENCOUNTER — Telehealth: Payer: Self-pay | Admitting: Family Medicine

## 2019-10-20 NOTE — Telephone Encounter (Signed)
Lvm to pick up wellness screening form. In folder up front

## 2019-11-05 ENCOUNTER — Other Ambulatory Visit: Payer: Self-pay

## 2019-11-05 ENCOUNTER — Encounter: Payer: Self-pay | Admitting: Gastroenterology

## 2019-11-05 ENCOUNTER — Ambulatory Visit: Payer: Managed Care, Other (non HMO) | Admitting: Gastroenterology

## 2019-11-05 VITALS — BP 128/80 | HR 73 | Temp 98.5°F | Ht 73.0 in | Wt 268.2 lb

## 2019-11-05 DIAGNOSIS — R197 Diarrhea, unspecified: Secondary | ICD-10-CM

## 2019-11-05 DIAGNOSIS — K219 Gastro-esophageal reflux disease without esophagitis: Secondary | ICD-10-CM

## 2019-11-05 NOTE — Progress Notes (Signed)
Eddie Antigua, MD [address]  [place]  [place], [place] [address]  Main: [phone]  Fax: [phone]   Primary Care Physician: Eddie Haven, MD  CC: Diarrhea  HPI: Eddie Lopez is a 39 y.o. male here for evaluation of diarrhea.  Patient has noted episodic diarrhea starting around May or June of this year.  States that specifically wakes him up at night where he will have stomach cramps and have to go to the bathroom and has a loose bowel movement.  Has 1 formed bowel movement every morning besides this.  No blood in stool.  Symptoms were more frequent in June occurring about 2-3 times a week and then became less frequent after that occurring about every other week.  However, have started to become more frequent and has had about 2 episodes in the last 2 weeks.  No weight loss, nausea or vomiting, dysphagia.  No family history of colon cancer.  No prior colonoscopy.  On previous visit Due to continued breakthrough symptoms on last visit, patient was on Prilosec and this was increased to twice daily dosing for 2 months.  After this, he was supposed to decrease it to once daily, but states symptoms were severe on once daily and he resumed to twice daily dosing. The recommendations were to repeat EGD in 2 to 3 years.  Previous history: On previous visits, we also noted that his May 2018 labs showed mildly elevated bilirubin chronically. Liver work-up, and abdominal ultrasound were ordered. Hepatic steatosis was reported.Blood work was negative for viral and autoimmune hepatitis. 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 His immunization records show that he was immunized to hepatitis a and B in the past.  The ultrasound also reported splenomegaly with spleen measuring 14.8 cm. He was referred to hematology by his primary care provider. And they have ordered work-up which has been unrevealing. Dr.  Grayland Ormond, wanted to obtain a CT scan of his abdomen, but was not approved by insurance.  An MRI was done instead and it is available under imaging, 10/12/2017, labeled "ultrasound outside films body" and reports a normal spleen.  His EGD on March 09, 2017, showed endoscopic features of eosinophilic esophagitis. Pathology report did not show any eosinophils in the distal or proximal esophagus, and reported changes compatible with reflux. Stomach biopsies showed mild chronic gastritis, negative for H. pylori. A small island of salmon colored mucosa and biopsies showed reflux gastroesophagitis, negative for goblet cells, dysplasia and malignancy.  We have tried sending record release to his [employer] where the EGD was done, but have not received any records from them despite multiple attempts.  As per previous notes: Reportedly had an EGD 4-5 yrs ago for acid reflux.He did not have any symptoms of food impaction at that time or now. EGD reportedly showed eosinophilic esophagitis at the time. He did not have any reports or pathology reports from this procedure. He was started on PPI at that time and then transitioned over to a swallowed steroid. He states the PPI helped with the swallowed steroid did not. Over time he stopped taking both medications. In June or July of this year his heartburn returned and his primary care provider prescribed Prilosec 40 mg daily in the morning. This has helped his daytime symptoms but he is continuing to have heartburn at night.  Current Outpatient Medications  Medication Sig Dispense Refill  . nebivolol (BYSTOLIC) 2.5 MG tablet Take by mouth.    Marland Kitchen  omeprazole (PRILOSEC) 20 MG capsule Take 20 mg by mouth daily.     No current facility-administered medications for this visit.    Allergies as of 11/05/2019  . (No Known Allergies)    ROS:  General: Negative for anorexia, weight loss, fever, chills, fatigue, weakness. ENT: Negative for  hoarseness, difficulty swallowing , nasal congestion. CV: Negative for chest pain, angina, palpitations, dyspnea on exertion, peripheral edema.  Respiratory: Negative for dyspnea at rest, dyspnea on exertion, cough, sputum, wheezing.  GI: See history of present illness. GU:  Negative for dysuria, hematuria, urinary incontinence, urinary frequency, nocturnal urination.  Endo: Negative for unusual weight change.    Physical Examination:  Vitals:   11/05/19 1523  Weight: 268 lb 3.2 oz (121.7 kg)     General: Well-nourished, well-developed in no acute distress.  Eyes: No icterus. Conjunctivae pink. Mouth: Oropharyngeal mucosa moist and pink , no lesions erythema or exudate. Neck: Supple, Trachea midline Abdomen: Bowel sounds are normal, nontender, nondistended, no hepatosplenomegaly or masses, no abdominal bruits or hernia , no rebound or guarding.   Extremities: No lower extremity edema. No clubbing or deformities. Neuro: Alert and oriented x 3.  Grossly intact. Skin: Warm and dry, no jaundice.   Psych: Alert and cooperative, normal mood and affect.   Labs: CMP     Component Value Date/Time   NA 138 10/13/2019 0849   NA 142 04/23/2017 0815   K 4.3 10/13/2019 0849   CL 102 10/13/2019 0849   CO2 26 10/13/2019 0849   GLUCOSE 94 10/13/2019 0849   BUN 13 10/13/2019 0849   BUN 13 04/23/2017 0815   CREATININE 1.01 10/13/2019 0849   CREATININE 0.85 10/04/2018 1412   CALCIUM 9.9 10/13/2019 0849   PROT 7.2 10/13/2019 0849   PROT 7.1 04/23/2017 0815   ALBUMIN 4.4 10/13/2019 0849   ALBUMIN 4.4 04/23/2017 0815   AST 32 10/13/2019 0849   ALT 56 (H) 10/13/2019 0849   ALKPHOS 83 10/13/2019 0849   BILITOT 1.2 10/13/2019 0849   BILITOT 0.8 04/23/2017 0815   GFRNONAA 96 04/23/2017 0815   GFRAA 111 04/23/2017 0815   Lab Results  Component Value Date   WBC 4.2 05/16/2017   HGB 16.4 05/16/2017   HCT 46.5 05/16/2017   MCV 83 05/16/2017   PLT 205 05/16/2017    Imaging Studies: No  results found.  Assessment and Plan:   [name] is a 39 y.o. 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  Given his symptoms we discussed colonoscopy with biopsies.  Patient prefers to have these procedures done in January, however if symptoms become more frequent, he will consider having them done sooner.  He is not having any blood in stool, weight loss or any other alarm symptoms at this time.  Fecal calprotectin ordered  He also has salmon-colored mucosa seen on EGD on previous visit and plan was to repeat EGD in 2 to 3 years.  Patient would also like this done in January along with his colonoscopy  We also discussed decreasing his Prilosec as that may also be contributing to his diarrhea.  He is afraid to decrease the medication given severe symptoms with once daily dosing.  However, he is willing to try Pepcid in the morning, Prilosec in the evening, since symptoms are worse in  the evening and at bedtime.  (Risks of PPI use were discussed with patient including bone loss, C. Diff diarrhea, pneumonia, infections, CKD, electrolyte abnormalities.  Pt. Verbalizes understanding and chooses to continue the medication.)  Patient educated extensively on acid reflux lifestyle modification, including buying a bed wedge, not eating 3 hrs before bedtime, diet modifications, and handout given for the same.   I have also advised him to abstain from any drinks with sugars or sweeteners as that could contribute to his diarrhea too    Dr Eddie Lopez

## 2019-11-12 IMAGING — CR DG CHEST 2V
1 series · 2 of 2 positions shown · non-contrast
Comparison: None.

CLINICAL DATA: Splenomegaly.

EXAM:
CHEST - 2 VIEW

[Series 1: dg chest 2 view · 0.14mm/px · 2 of 2 slices shown]
[im 1/2]
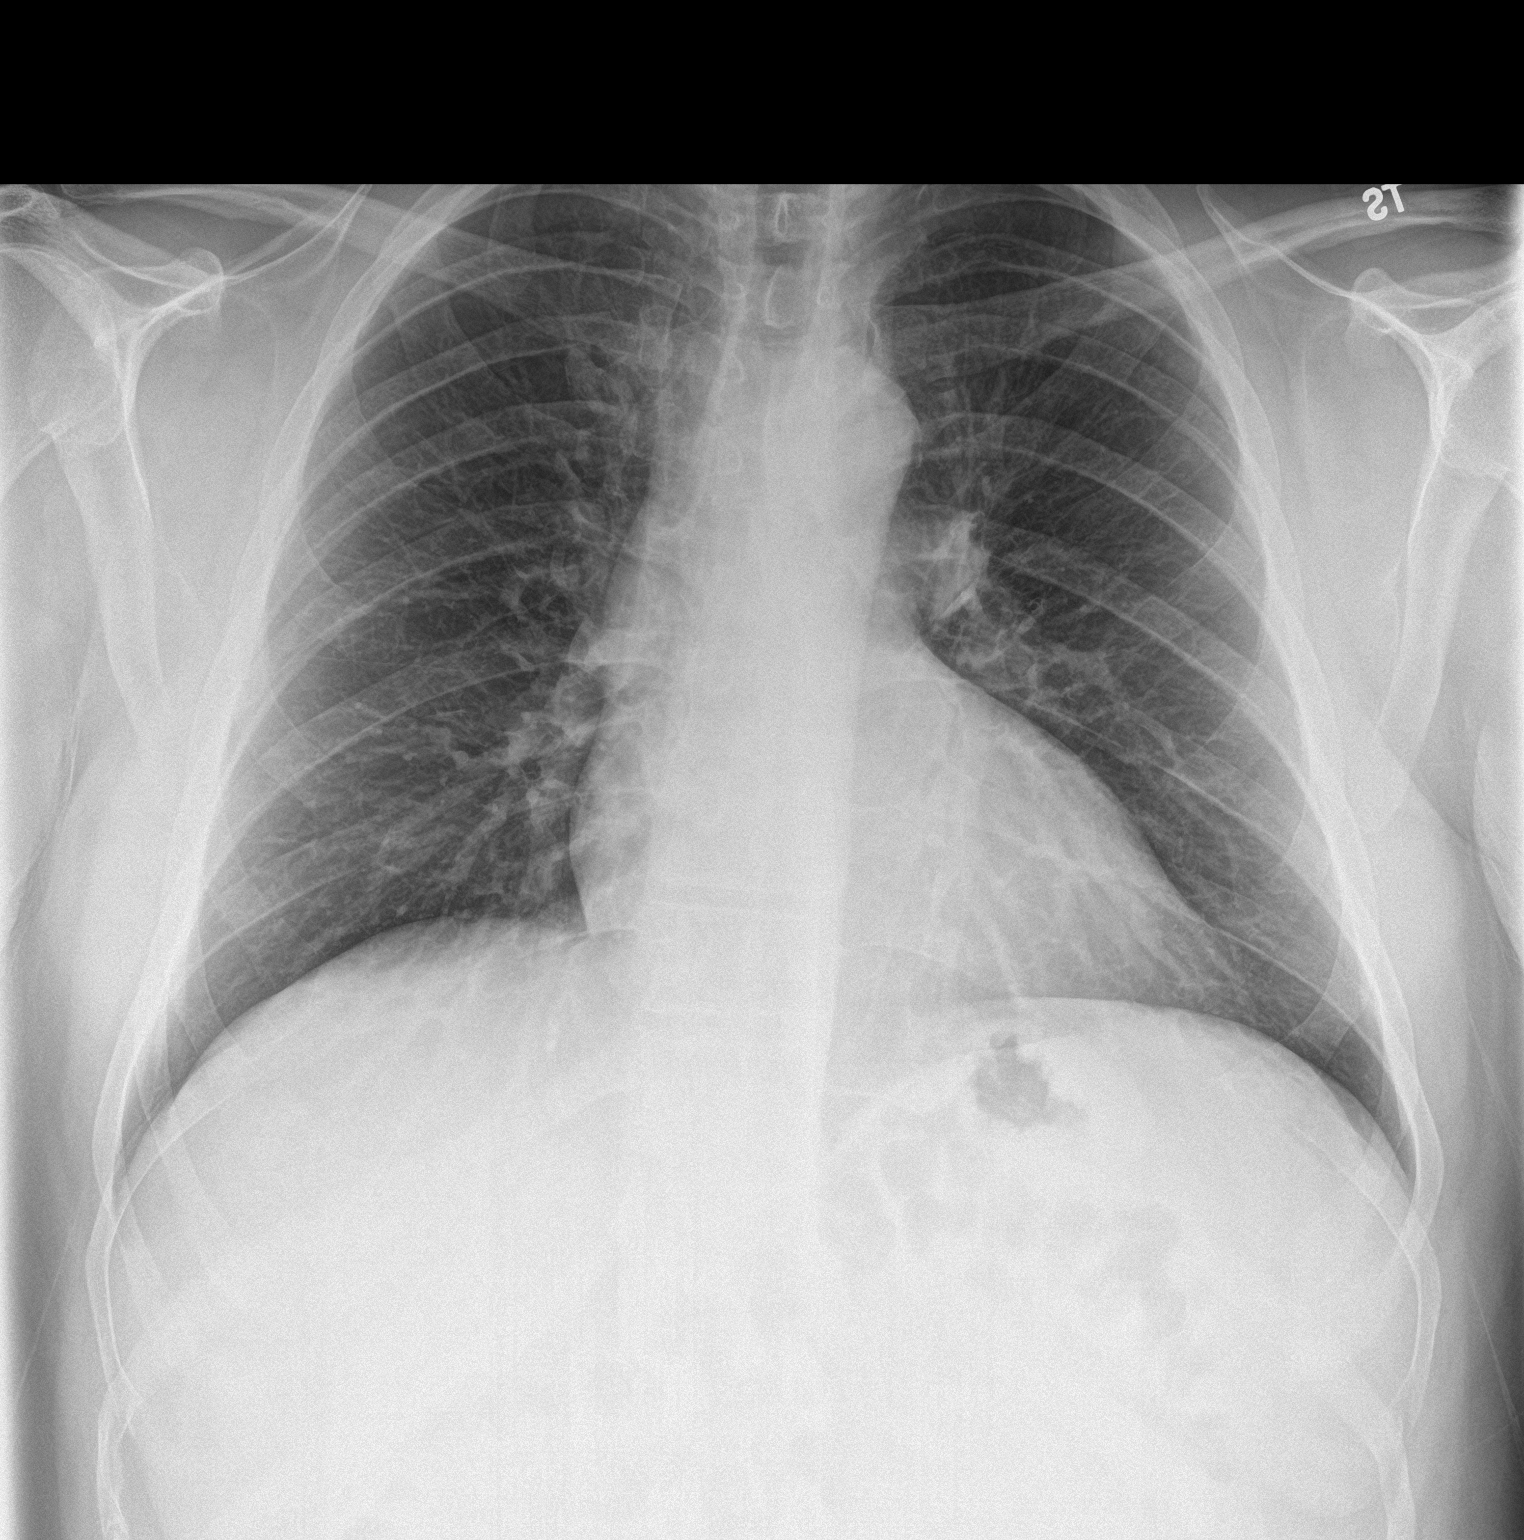
[im 2/2]
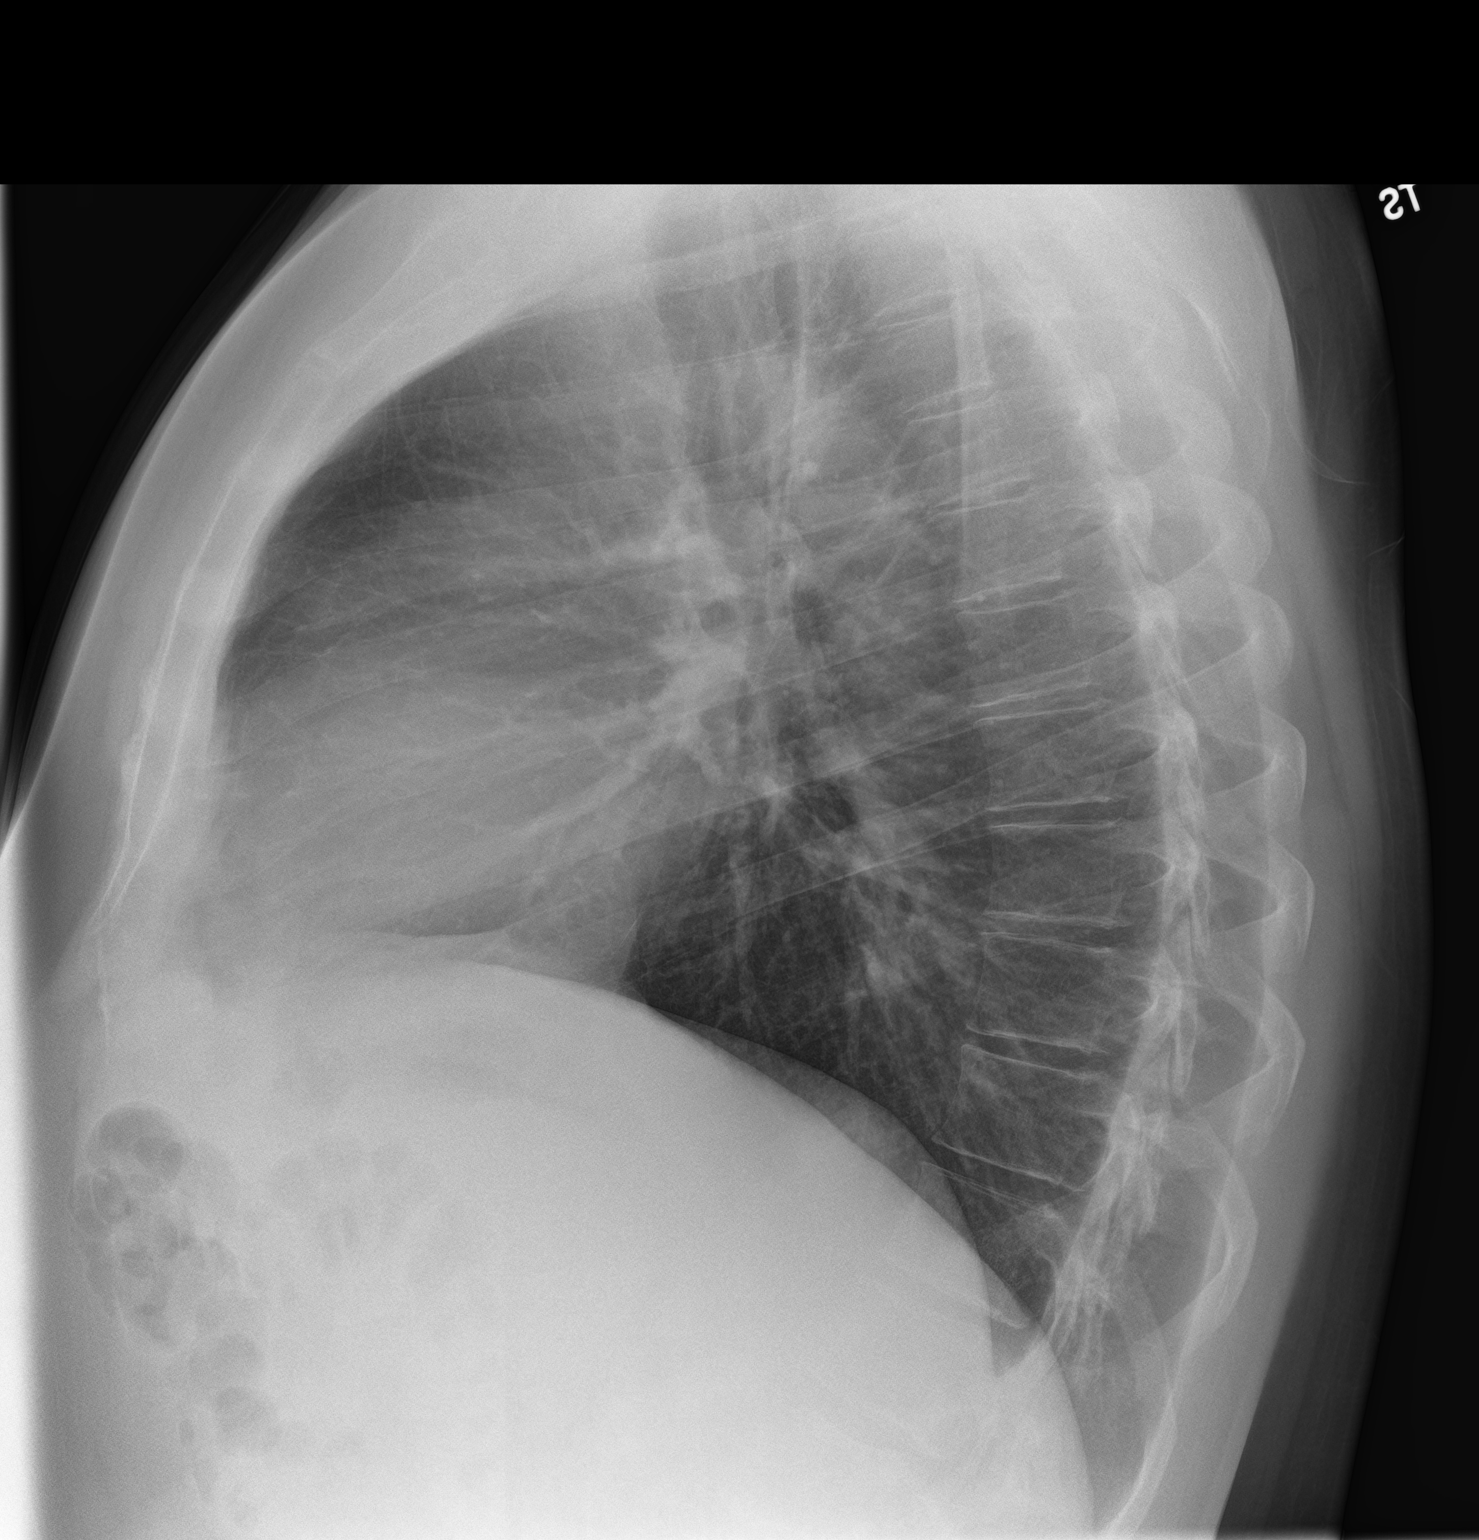

[2 of 2 positions shown; findings below may reference images not displayed]

FINDINGS: The heart size and mediastinal contours are within normal limits.
Both lungs are clear. No pneumothorax or pleural effusion is noted.
The visualized skeletal structures are unremarkable.
IMPRESSION: No active cardiopulmonary disease.

## 2019-11-17 ENCOUNTER — Other Ambulatory Visit (INDEPENDENT_AMBULATORY_CARE_PROVIDER_SITE_OTHER): Payer: Managed Care, Other (non HMO)

## 2019-11-17 ENCOUNTER — Other Ambulatory Visit: Payer: Self-pay

## 2019-11-17 DIAGNOSIS — R945 Abnormal results of liver function studies: Secondary | ICD-10-CM

## 2019-11-17 DIAGNOSIS — R7989 Other specified abnormal findings of blood chemistry: Secondary | ICD-10-CM

## 2019-11-17 LAB — HEPATIC FUNCTION PANEL
ALT: 46 U/L (ref 0–53)
AST: 27 U/L (ref 0–37)
Albumin: 4.7 g/dL (ref 3.5–5.2)
Alkaline Phosphatase: 78 U/L (ref 39–117)
Bilirubin, Direct: 0.2 mg/dL (ref 0.0–0.3)
Total Bilirubin: 1.4 mg/dL — ABNORMAL HIGH (ref 0.2–1.2)
Total Protein: 7.1 g/dL (ref 6.0–8.3)

## 2020-02-09 ENCOUNTER — Other Ambulatory Visit: Payer: Self-pay

## 2020-02-09 ENCOUNTER — Telehealth: Payer: Self-pay

## 2020-02-09 DIAGNOSIS — K219 Gastro-esophageal reflux disease without esophagitis: Secondary | ICD-10-CM

## 2020-02-09 DIAGNOSIS — Z1211 Encounter for screening for malignant neoplasm of colon: Secondary | ICD-10-CM

## 2020-02-09 MED ORDER — NA SULFATE-K SULFATE-MG SULF 17.5-3.13-1.6 GM/177ML PO SOLN: ORAL | 0 refills | Status: DC

## 2020-02-09 NOTE — Telephone Encounter (Signed)
Patient called wanting to schedule his procedures (EGD and Colonoscopy) as discussed on his last office visit with Dr. Bonna Gains on 11/05/2019. Patient scheduled his procedure on 03/26/2020 at [facility]. Patient was given verbal instructions and will be given instructions via MyChart per patient's request. Patient's prep solution will be sent to her pharmacy.

## 2020-03-04 ENCOUNTER — Telehealth: Payer: Managed Care, Other (non HMO) | Admitting: Orthopedic Surgery

## 2020-03-04 DIAGNOSIS — J029 Acute pharyngitis, unspecified: Secondary | ICD-10-CM

## 2020-03-04 MED ORDER — LIDOCAINE VISCOUS HCL 2 % MT SOLN: 15.0000 mL | Freq: Four times a day (QID) | OROMUCOSAL | 0 refills | Status: DC | PRN

## 2020-03-04 NOTE — Progress Notes (Signed)
We are sorry that you are not feeling well.  Here is how we plan to help!  Sorry for the delay.  I will prescribe a mouthwash for the sore throat. Viscous lidocaine 28ml gargle for 30 seconds and spit every 6 hours as needed. It is safe to swallow.  Your symptoms indicate a likely viral infection (Pharyngitis).   Pharyngitis is inflammation in the back of the throat which can cause a sore throat, scratchiness and sometimes difficulty swallowing.   Pharyngitis is typically caused by a respiratory virus and will just run its course.  Please keep in mind that your symptoms could last up to 10 days.  For throat pain, we recommend over the counter oral pain relief medications such as acetaminophen or aspirin, or anti-inflammatory medications such as ibuprofen or naproxen sodium.  Topical treatments such as oral throat lozenges or sprays may be used as needed.  Avoid close contact with loved ones, especially the very young and elderly.  Remember to wash your hands thoroughly throughout the day as this is the number one way to prevent the spread of infection and wipe down door knobs and counters with disinfectant.  After careful review of your answers, I would not recommend and antibiotic for your condition.  Antibiotics should not be used to treat conditions that we suspect are caused by viruses like the virus that causes the common cold or flu. However, some people can have Strep with atypical symptoms. You may need formal testing in clinic or office to confirm if your symptoms continue or worsen.  Providers prescribe antibiotics to treat infections caused by bacteria. Antibiotics are very powerful in treating bacterial infections when they are used properly.  To maintain their effectiveness, they should be used only when necessary.  Overuse of antibiotics has resulted in the development of super bugs that are resistant to treatment!    Home Care:  Only take medications as instructed by your medical  team.  Do not drink alcohol while taking these medications.  A steam or ultrasonic humidifier can help congestion.  You can place a towel over your head and breathe in the steam from hot water coming from a faucet.  Avoid close contacts especially the very young and the elderly.  Cover your mouth when you cough or sneeze.  Always remember to wash your hands.  Get Help Right Away If:  You develop worsening fever or throat pain.  You develop a severe head ache or visual changes.  Your symptoms persist after you have completed your treatment plan.  Make sure you  Understand these instructions.  Will watch your condition.  Will get help right away if you are not doing well or get worse.  Your e-visit answers were reviewed by a board certified advanced clinical practitioner to complete your personal care plan.  Depending on the condition, your plan could have included both over the counter or prescription medications.  If there is a problem please reply  once you have received a response from your provider.  Your safety is important to [place].  If you have drug allergies check your prescription carefully.    You can use MyChart to ask questions about todays visit, request a non-urgent call back, or ask for a work or school excuse for 24 hours related to this e-Visit. If it has been greater than 24 hours you will need to follow up with your provider, or enter a new e-Visit to address those concerns.  You will get an e-mail  in the next two days asking about your experience.  I hope that your e-visit has been valuable and will speed your recovery. Thank you for using e-visits.  Greater than 5 minutes, yet less than 10 minutes of time have been spent researching, coordinating and implementing care for this patient today.

## 2020-03-10 ENCOUNTER — Other Ambulatory Visit
Admission: RE | Admit: 2020-03-10 | Discharge: 2020-03-10 | Disposition: A | Discharge status: Home / Self Care | Payer: Managed Care, Other (non HMO) | Source: Ambulatory Visit | Attending: Gastroenterology | Admitting: Gastroenterology

## 2020-03-10 ENCOUNTER — Telehealth: Payer: Self-pay

## 2020-03-10 ENCOUNTER — Other Ambulatory Visit: Payer: Self-pay

## 2020-03-10 DIAGNOSIS — Z20822 Contact with and (suspected) exposure to covid-19: Secondary | ICD-10-CM | POA: Insufficient documentation

## 2020-03-10 DIAGNOSIS — Z01812 Encounter for preprocedural laboratory examination: Secondary | ICD-10-CM | POA: Diagnosis not present

## 2020-03-10 NOTE — Telephone Encounter (Signed)
error 

## 2020-03-11 ENCOUNTER — Encounter: Payer: Self-pay | Admitting: Gastroenterology

## 2020-03-11 LAB — SARS CORONAVIRUS 2 (TAT 6-24 HRS): SARS Coronavirus 2: NEGATIVE

## 2020-03-12 ENCOUNTER — Ambulatory Visit: Payer: Managed Care, Other (non HMO) | Admitting: Certified Registered"

## 2020-03-12 ENCOUNTER — Ambulatory Visit
Admission: RE | Admit: 2020-03-12 | Discharge: 2020-03-12 | Disposition: A | Discharge status: Home / Self Care | Payer: Managed Care, Other (non HMO) | Attending: Gastroenterology | Admitting: Gastroenterology

## 2020-03-12 ENCOUNTER — Encounter
Admission: RE | Disposition: A | Discharge status: Home / Self Care | Payer: Self-pay | Source: Home / Self Care | Attending: Gastroenterology

## 2020-03-12 ENCOUNTER — Encounter: Payer: Self-pay | Admitting: Gastroenterology

## 2020-03-12 DIAGNOSIS — Z79899 Other long term (current) drug therapy: Secondary | ICD-10-CM | POA: Insufficient documentation

## 2020-03-12 DIAGNOSIS — K317 Polyp of stomach and duodenum: Secondary | ICD-10-CM | POA: Diagnosis not present

## 2020-03-12 DIAGNOSIS — K648 Other hemorrhoids: Secondary | ICD-10-CM | POA: Insufficient documentation

## 2020-03-12 DIAGNOSIS — Z801 Family history of malignant neoplasm of trachea, bronchus and lung: Secondary | ICD-10-CM | POA: Diagnosis not present

## 2020-03-12 DIAGNOSIS — K2270 Barrett's esophagus without dysplasia: Secondary | ICD-10-CM | POA: Diagnosis not present

## 2020-03-12 DIAGNOSIS — Z809 Family history of malignant neoplasm, unspecified: Secondary | ICD-10-CM | POA: Insufficient documentation

## 2020-03-12 DIAGNOSIS — K200 Eosinophilic esophagitis: Secondary | ICD-10-CM | POA: Insufficient documentation

## 2020-03-12 DIAGNOSIS — K2289 Other specified disease of esophagus: Secondary | ICD-10-CM | POA: Insufficient documentation

## 2020-03-12 DIAGNOSIS — R197 Diarrhea, unspecified: Secondary | ICD-10-CM

## 2020-03-12 DIAGNOSIS — K219 Gastro-esophageal reflux disease without esophagitis: Secondary | ICD-10-CM | POA: Diagnosis not present

## 2020-03-12 DIAGNOSIS — D120 Benign neoplasm of cecum: Secondary | ICD-10-CM | POA: Diagnosis not present

## 2020-03-12 DIAGNOSIS — K635 Polyp of colon: Secondary | ICD-10-CM

## 2020-03-12 DIAGNOSIS — Z1211 Encounter for screening for malignant neoplasm of colon: Secondary | ICD-10-CM

## 2020-03-12 HISTORY — PX: ESOPHAGOGASTRODUODENOSCOPY (EGD) WITH PROPOFOL: SHX5813

## 2020-03-12 HISTORY — PX: COLONOSCOPY WITH PROPOFOL: SHX5780

## 2020-03-12 SURGERY — ESOPHAGOGASTRODUODENOSCOPY (EGD) WITH PROPOFOL
Anesthesia: General

## 2020-03-12 MED ORDER — LIDOCAINE 2% (20 MG/ML) 5 ML SYRINGE
INTRAMUSCULAR | Status: DC | PRN
  Administered 2020-03-12: 25 mg via INTRAVENOUS

## 2020-03-12 MED ORDER — FENTANYL CITRATE (PF) 100 MCG/2ML IJ SOLN
INTRAMUSCULAR | Status: AC
  Filled 2020-03-12: qty 2

## 2020-03-12 MED ORDER — MIDAZOLAM HCL 2 MG/2ML IJ SOLN
INTRAMUSCULAR | Status: AC
  Filled 2020-03-12: qty 2

## 2020-03-12 MED ORDER — PROPOFOL 500 MG/50ML IV EMUL
INTRAVENOUS | Status: AC
  Filled 2020-03-12: qty 50

## 2020-03-12 MED ORDER — PROPOFOL 10 MG/ML IV BOLUS
INTRAVENOUS | Status: DC | PRN
  Administered 2020-03-12: 100 mg via INTRAVENOUS

## 2020-03-12 MED ORDER — FENTANYL CITRATE (PF) 100 MCG/2ML IJ SOLN
INTRAMUSCULAR | Status: DC | PRN
  Administered 2020-03-12 (×2): 50 ug via INTRAVENOUS

## 2020-03-12 MED ORDER — SODIUM CHLORIDE 0.9 % IV SOLN: INTRAVENOUS | Status: DC

## 2020-03-12 MED ORDER — GLYCOPYRROLATE 0.2 MG/ML IJ SOLN
INTRAMUSCULAR | Status: AC
  Filled 2020-03-12: qty 1

## 2020-03-12 MED ORDER — GLYCOPYRROLATE 0.2 MG/ML IJ SOLN
INTRAMUSCULAR | Status: DC | PRN
  Administered 2020-03-12: .2 mg via INTRAVENOUS

## 2020-03-12 MED ORDER — MIDAZOLAM HCL 5 MG/5ML IJ SOLN
INTRAMUSCULAR | Status: DC | PRN
  Administered 2020-03-12: 2 mg via INTRAVENOUS

## 2020-03-12 MED ORDER — PROPOFOL 500 MG/50ML IV EMUL
INTRAVENOUS | Status: DC | PRN
  Administered 2020-03-12: 120 ug/kg/min via INTRAVENOUS

## 2020-03-12 NOTE — Anesthesia Postprocedure Evaluation (Signed)
Anesthesia Post Note  Patient: Eddie Lopez  Procedure(s) Performed: ESOPHAGOGASTRODUODENOSCOPY (EGD) WITH PROPOFOL (N/A ) COLONOSCOPY WITH PROPOFOL (N/A )  Patient location during evaluation: Endoscopy Anesthesia Type: General Level of consciousness: awake and alert Pain management: pain level controlled Vital Signs Assessment: post-procedure vital signs reviewed and stable Respiratory status: spontaneous breathing, nonlabored ventilation, respiratory function stable and patient connected to nasal cannula oxygen Cardiovascular status: blood pressure returned to baseline and stable Postop Assessment: no apparent nausea or vomiting Anesthetic complications: no   No complications documented.   Last Vitals:  Vitals:   03/12/20 0959 03/12/20 1130  BP: (!) 137/96 102/70  Pulse: 65   Resp: 18   Temp: (!) 36.4 C (!) 35.8 C  SpO2: 100%     Last Pain:  Vitals:   03/12/20 1130  TempSrc: Temporal  PainSc: Asleep                 Martha Clan

## 2020-03-12 NOTE — Anesthesia Preprocedure Evaluation (Signed)
Anesthesia Evaluation  Patient identified by MRN, date of birth, ID band Patient awake    Reviewed: Allergy & Precautions, NPO status , Patient's Chart, lab work & pertinent test results  History of Anesthesia Complications Negative for: history of anesthetic complications  Airway Mallampati: III       Dental  (+) Dental Advidsory Given, Teeth Intact, Caps   Pulmonary neg pulmonary ROS, neg sleep apnea, neg COPD, Not current smoker,           Cardiovascular (-) hypertension(-) angina(-) Past MI, (-) Cardiac Stents and (-) CHF + dysrhythmias (occassional palpitations) (-) Valvular Problems/Murmurs     Neuro/Psych neg Seizures negative neurological ROS     GI/Hepatic Neg liver ROS, GERD  Medicated and Poorly Controlled,  Endo/Other  negative endocrine ROSneg diabetes  Renal/GU negative Renal ROS     Musculoskeletal   Abdominal   Peds  Hematology   Anesthesia Other Findings Past Medical History: No date: GERD (gastroesophageal reflux disease) No date: Palpitations   Reproductive/Obstetrics                             Anesthesia Physical  Anesthesia Plan  ASA: II  Anesthesia Plan: General   Post-op Pain Management:    Induction: Intravenous  PONV Risk Score and Plan: 2 and TIVA and Propofol infusion  Airway Management Planned: Nasal Cannula and Natural Airway  Additional Equipment:   Intra-op Plan:   Post-operative Plan:   Informed Consent: I have reviewed the patients History and Physical, chart, labs and discussed the procedure including the risks, benefits and alternatives for the proposed anesthesia with the patient or authorized representative who has indicated his/her understanding and acceptance.       Plan Discussed with:   Anesthesia Plan Comments:         Anesthesia Quick Evaluation

## 2020-03-12 NOTE — Transfer of Care (Signed)
Immediate Anesthesia Transfer of Care Note  Patient: Eddie Lopez  Procedure(s) Performed: ESOPHAGOGASTRODUODENOSCOPY (EGD) WITH PROPOFOL (N/A ) COLONOSCOPY WITH PROPOFOL (N/A )  Patient Location: Endoscopy Unit  Anesthesia Type:General  Level of Consciousness: sedated  Airway & Oxygen Therapy: Patient Spontanous Breathing  Post-op Assessment: Report given to RN and Post -op Vital signs reviewed and stable  Post vital signs: Reviewed  Last Vitals:  Vitals Value Taken Time  BP    Temp    Pulse 58 03/12/20 1130  Resp 13 03/12/20 1130  SpO2 94 % 03/12/20 1130  Vitals shown include unvalidated device data.  Last Pain:  Vitals:   03/12/20 0959  TempSrc: Skin  PainSc: 0-No pain         Complications: No complications documented.

## 2020-03-12 NOTE — H&P (Signed)
Eddie Antigua, MD [address] [place], [place], [place], [address] [address], [place], [place], [place], [address] Phone: [phone]  Fax: [phone]  Primary Care Physician:  [name], MD   Pre-Procedure History & Physical: HPI:  Eddie Lopez is a 40 y.o. male is here for a colonoscopy and EGD.   Past Medical History:  Diagnosis Date  . GERD (gastroesophageal reflux disease)   . Palpitations     Past Surgical History:  Procedure Laterality Date  . ESOPHAGOGASTRODUODENOSCOPY (EGD) WITH PROPOFOL N/A 03/09/2017   Procedure: ESOPHAGOGASTRODUODENOSCOPY (EGD) WITH PROPOFOL;  Surgeon: Virgel Manifold, MD;  Location: [facility] ENDOSCOPY;  Service: Endoscopy;  Laterality: N/A;    Prior to Admission medications   Medication Sig Start Date End Date Taking? Authorizing Provider  nebivolol (BYSTOLIC) 2.5 MG tablet Take by mouth. 08/15/17  Yes [provider]  omeprazole (PRILOSEC) 20 MG capsule Take 20 mg by mouth 2 (two) times daily before a meal.    Yes [provider]  lidocaine (XYLOCAINE) 2 % solution Use as directed 15 mLs in the mouth or throat every 6 (six) hours as needed (Throat pain). 03/04/20   Lisette Abu, PA-C  Na Sulfate-K Sulfate-Mg Sulf 17.5-3.13-1.6 GM/177ML SOLN At 5 PM the day before procedure take 1 bottle and 5 hours before procedure take 1 bottle. 02/09/20   Virgel Manifold, MD    Allergies as of 02/09/2020  . (No Known Allergies)    Family History  Problem Relation Age of Onset  . Throat cancer Maternal Grandfather   . Cancer Maternal Grandmother   . Cancer Paternal Grandmother     Social History   Socioeconomic History  . Marital status: Married    Spouse name: Not on file  . Number of children: Not on file  . Years of education: Not on file  . Highest education level: Not on file  Occupational History  . Not on file  Tobacco Use  . Smoking status: Never Smoker  . Smokeless tobacco: Never  Used  Vaping Use  . Vaping Use: Never used  Substance and Sexual Activity  . Alcohol use: Yes    Alcohol/week: 0.0 standard drinks    Comment: 1 beer a week at most weekly  . Drug use: No  . Sexual activity: Yes  Other Topics Concern  . Not on file  Social History Narrative  . Not on file   Social Determinants of Health   Financial Resource Strain: Not on file  Food Insecurity: Not on file  Transportation Needs: Not on file  Physical Activity: Not on file  Stress: Not on file  Social Connections: Not on file  Intimate Partner Violence: Not on file    Review of Systems: See HPI, otherwise negative ROS  Physical Exam: BP (!) 137/96   Pulse 65   Temp (!) 97.5 F (36.4 C) (Skin)   Resp 18   Ht 6\' 1"  (1.854 m)   Wt 117.9 kg   SpO2 100%   BMI 34.30 kg/m  General:   Alert,  pleasant and cooperative in NAD Head:  Normocephalic and atraumatic. Neck:  Supple; no masses or thyromegaly. Lungs:  Clear throughout to auscultation, normal respiratory effort.    Heart:  +S1, +S2, Regular rate and rhythm, No edema. Abdomen:  Soft, nontender and nondistended. Normal bowel sounds, without guarding, and without rebound.   Neurologic:  Alert and  oriented x4;  grossly normal neurologically.  Impression/Plan: Eddie Lopez is here for a  colonoscopy to be performed for diarrhea and EGD for Acid Reflux and evaluate for EoE.  Risks, benefits, limitations, and alternatives regarding the procedures have been reviewed with the patient.  Questions have been answered.  All parties agreeable.   Virgel Manifold, MD  03/12/2020, 10:22 AM

## 2020-03-12 NOTE — Op Note (Signed)
[facility] Patient Name: Eddie Lopez Procedure Date: 03/12/2020 10:23 AM MRN: [id number] Account #: [id number] Date of Birth: September 02, 1980 Admit Type: Outpatient Age: 40 Room: [facility] ENDO ROOM 1 Gender: Male Note Status: Finalized Procedure:             Upper GI endoscopy Indications:           Follow-up of eosinophilic esophagitis, Diarrhea Providers:             Calyse Murcia B. Bonna Gains MD, MD Referring MD:          Angela Adam. Caryl Bis (Referring MD) Medicines:             Monitored Anesthesia Care Complications:         No immediate complications. Procedure:             Pre-Anesthesia Assessment:                        - Prior to the procedure, a History and Physical was                         performed, and patient medications, allergies and                         sensitivities were reviewed. The patient's tolerance                         of previous anesthesia was reviewed.                        - The risks and benefits of the procedure and the                         sedation options and risks were discussed with the                         patient. All questions were answered and informed                         consent was obtained.                        - Patient identification and proposed procedure were                         verified prior to the procedure by the physician, the                         nurse, the anesthesiologist, the anesthetist and the                         technician. The procedure was verified in the                         procedure room.                        - ASA Grade Assessment: II - A patient with mild  systemic disease.                        After obtaining informed consent, the endoscope was                         passed under direct vision. Throughout the procedure,                         the patient's blood pressure, pulse, and oxygen                         saturations  were monitored continuously. The Endoscope                         was introduced through the mouth, and advanced to the                         second part of duodenum. The upper GI endoscopy was                         accomplished with ease. The patient tolerated the                         procedure well. Findings:      Salmon-colored mucosa was present. Mucosa was biopsied with a cold       forceps for histology.      There is no endoscopic evidence of stenosis or stricture in the entire       esophagus. Biopsies were obtained from the proximal and distal esophagus       with cold forceps for histology of suspected eosinophilic esophagitis.      The entire examined stomach was normal. Biopsies were obtained in the       gastric body, at the incisura and in the gastric antrum with cold       forceps for histology.      A single 5 mm sessile polyp with no bleeding was found in the second       portion of the duodenum. Biopsies were taken with a cold forceps for       histology. A very small biopsy was taken from the top of the lesion       only. What appeared to be the ampulla was seen 2-3 cm distal to this       lesion and was not biopsied or intervened on in any way.      The exam of the duodenum was otherwise normal. Impression:            - Salmon-colored mucosa suspicious for short-segment                         Barrett's esophagus. Biopsied.                        - Normal stomach.                        - A single duodenal polyp. Biopsied.                        - Biopsies were obtained in the gastric  body, at the                         incisura and in the gastric antrum. Recommendation:        - Await pathology results.                        - Discharge patient to home (with escort).                        - Advance diet as tolerated.                        - Continue present medications.                        - Patient has a contact number available for                          emergencies. The signs and symptoms of potential                         delayed complications were discussed with the patient.                         Return to normal activities tomorrow. Written                         discharge instructions were provided to the patient.                        - Discharge patient to home (with escort).                        - The findings and recommendations were discussed with                         the patient.                        - The findings and recommendations were discussed with                         the patient's family. Procedure Code(s):     --- Professional ---                        [phone], Esophagogastroduodenoscopy, flexible,                         transoral; with biopsy, single or multiple Diagnosis Code(s):     --- Professional ---                        K22.8, Other specified diseases of esophagus                        K31.7, Polyp of stomach and duodenum                        P61.9, Eosinophilic esophagitis  R19.7, Diarrhea, unspecified CPT copyright 2019 American Medical Association. All rights reserved. The codes documented in this report are preliminary and upon coder review may  be revised to meet current compliance requirements.  [name], MD Margretta Sidle B. Bonna Gains MD, MD 03/12/2020 11:06:58 AM This report has been signed electronically. Number of Addenda: 0 Note Initiated On: 03/12/2020 10:23 AM Estimated Blood Loss:  Estimated blood loss: none.      [facility]

## 2020-03-12 NOTE — Op Note (Signed)
[facility] Gastroenterology Patient Name: Eddie Lopez Procedure Date: 03/12/2020 10:21 AM MRN: [id number] Account #: [id number] Date of Birth: [birth year] Admit Type: Outpatient Age: 40 Room: [facility] ENDO ROOM 1 Gender: Male Note Status: Finalized Procedure:             Colonoscopy Indications:           Clinically significant diarrhea of unexplained origin Providers:             Mollee Neer B. Bonna Gains MD, MD Referring MD:          Angela Adam. Caryl Bis (Referring MD) Medicines:             Monitored Anesthesia Care Complications:         No immediate complications. Procedure:             Pre-Anesthesia Assessment:                        - ASA Grade Assessment: II - A patient with mild                         systemic disease.                        - Prior to the procedure, a History and Physical was                         performed, and patient medications, allergies and                         sensitivities were reviewed. The patient's tolerance                         of previous anesthesia was reviewed.                        - The risks and benefits of the procedure and the                         sedation options and risks were discussed with the                         patient. All questions were answered and informed                         consent was obtained.                        - Patient identification and proposed procedure were                         verified prior to the procedure by the physician, the                         nurse, the anesthesiologist, the anesthetist and the                         technician. The procedure was verified in the                         procedure  room.                        After obtaining informed consent, the colonoscope was                         passed under direct vision. Throughout the procedure,                         the patient's blood pressure, pulse, and oxygen                         saturations  were monitored continuously. The                         Colonoscope was introduced through the anus and                         advanced to the the terminal ileum. The colonoscopy                         was performed with ease. The patient tolerated the                         procedure well. The quality of the bowel preparation                         was good. Findings:      The perianal and digital rectal examinations were normal.      Two sessile polyps were found in the ascending colon and cecum. The       polyps were 5 to 6 mm in size. These polyps were removed with a cold       snare. Resection and retrieval were complete.      The exam was otherwise without abnormality.      The rectum, sigmoid colon, descending colon, transverse colon, ascending       colon, cecum and ileum appeared normal. Biopsies were taken with a cold       forceps for histology.      Non-bleeding internal hemorrhoids were found during retroflexion.      No additional abnormalities were found on retroflexion. Impression:            - Two 5 to 6 mm polyps in the ascending colon and in                         the cecum, removed with a cold snare. Resected and                         retrieved.                        - The examination was otherwise normal.                        - The rectum, sigmoid colon, descending colon,                         transverse colon, ascending colon, cecum and terminal  ileum are normal. Biopsied.                        - Non-bleeding internal hemorrhoids. Recommendation:        - Discharge patient to home (with escort).                        - Advance diet as tolerated.                        - Continue present medications.                        - Await pathology results.                        - Repeat colonoscopy date to be determined after                         pending pathology results are reviewed.                        - The findings and  recommendations were discussed with                         the patient.                        - The findings and recommendations were discussed with                         the patient's family.                        - Return to primary care physician as previously                         scheduled. Procedure Code(s):     --- Professional ---                        [phone], Colonoscopy, flexible; with removal of                         tumor(s), polyp(s), or other lesion(s) by snare                         technique                        [id number], 72, Colonoscopy, flexible; with biopsy, single                         or multiple Diagnosis Code(s):     --- Professional ---                        K63.5, Polyp of colon                        K64.8, Other hemorrhoids                        R19.7, Diarrhea, unspecified CPT copyright  2019 American Medical Association. All rights reserved. The codes documented in this report are preliminary and upon coder review may  be revised to meet current compliance requirements.  [name], MD Margretta Sidle B. Bonna Gains MD, MD 03/12/2020 11:33:12 AM This report has been signed electronically. Number of Addenda: 0 Note Initiated On: 03/12/2020 10:21 AM Scope Withdrawal Time: 0 hours 15 minutes 13 seconds  Total Procedure Duration: 0 hours 18 minutes 31 seconds  Estimated Blood Loss:  Estimated blood loss: none.      [facility]

## 2020-03-15 LAB — SURGICAL PATHOLOGY

## 2020-03-29 ENCOUNTER — Telehealth: Payer: Self-pay

## 2020-03-29 DIAGNOSIS — D132 Benign neoplasm of duodenum: Secondary | ICD-10-CM

## 2020-03-29 NOTE — Telephone Encounter (Signed)
-----   Message from Virgel Manifold, MD sent at 03/24/2020 11:03 AM EST ----- Herb Grays please let the patient know, his biopsies show increased number of eosinophils. We need to increase his omeprazole to 40 mg twice daily for EOE. Please give him 90-day supply and have him follow-up with me in 4 weeks to reassess symptoms. He also had a duodenal polyp that needs further evaluation for possible removal versus EUS with Dr. Rush Landmark. Please refer to Dr. Donneta Romberg office. If you would like to wait for the referral till he sees me in clinic, you can move up his follow-up appointment with me to the next 2 to 3 weeks

## 2020-04-15 MED ORDER — OMEPRAZOLE 40 MG PO CPDR: 40.0000 mg | DELAYED_RELEASE_CAPSULE | Freq: Two times a day (BID) | ORAL | 0 refills | Status: DC

## 2020-04-15 NOTE — Telephone Encounter (Signed)
Patient finally called me back and he stated that he is cancelling his follow up with Dr. Fontaine No since he already had his procedure done and is not interested in following up at this time. However, I will go ahead and send the referral to Dr. Donneta Romberg office. They should contact the patient with a date and time.

## 2020-04-19 DIAGNOSIS — M7052 Other bursitis of knee, left knee: Secondary | ICD-10-CM | POA: Insufficient documentation

## 2020-04-30 ENCOUNTER — Telehealth: Payer: Self-pay | Admitting: Gastroenterology

## 2020-04-30 NOTE — Telephone Encounter (Signed)
Reviewed chart and EGD. Duodenal polyp noted with need for resection. Patty please move forward with scheduling a 60-minute EGD with EMR at [facility] or [facility]. If patient wants to be seen in clinic you can set them up for clinic otherwise I am okay for a direct procedure. Please update Dr. Bonna Gains and I with the date. Thanks. GM

## 2020-04-30 NOTE — Telephone Encounter (Signed)
Hi Dr.Mansouraty, we have received a referral from [place] for further evaluation of duodenal polyp. Records are in Epic. Please advise on scheduling. Thank you.

## 2020-05-03 ENCOUNTER — Other Ambulatory Visit: Payer: Self-pay

## 2020-05-03 DIAGNOSIS — D132 Benign neoplasm of duodenum: Secondary | ICD-10-CM

## 2020-05-03 NOTE — Telephone Encounter (Signed)
The pt has been scheduled and instructed.  He will call with any questions or concerns.  All information mailed and sent to My Chart.

## 2020-05-03 NOTE — Telephone Encounter (Signed)
The pt has been scheduled for 06/07/20 at 11 am at [facility] with Dr Rush Landmark  COVID test on 06/03/20 at 1110 am

## 2020-05-04 NOTE — Telephone Encounter (Signed)
Thanks for the update. GM

## 2020-06-02 ENCOUNTER — Other Ambulatory Visit: Payer: Self-pay

## 2020-06-02 ENCOUNTER — Encounter (HOSPITAL_COMMUNITY): Payer: Self-pay | Admitting: Gastroenterology

## 2020-06-02 NOTE — Progress Notes (Signed)
Attempted to obtain medical history via telephone, unable to reach at this time. I left a voicemail to return pre surgical testing department's phone call.  

## 2020-06-03 ENCOUNTER — Other Ambulatory Visit (HOSPITAL_COMMUNITY)
Admission: RE | Admit: 2020-06-03 | Discharge: 2020-06-03 | Disposition: A | Discharge status: Home / Self Care | Payer: Managed Care, Other (non HMO) | Source: Ambulatory Visit | Attending: Gastroenterology | Admitting: Gastroenterology

## 2020-06-03 DIAGNOSIS — Z01812 Encounter for preprocedural laboratory examination: Secondary | ICD-10-CM | POA: Insufficient documentation

## 2020-06-03 DIAGNOSIS — Z20822 Contact with and (suspected) exposure to covid-19: Secondary | ICD-10-CM | POA: Insufficient documentation

## 2020-06-03 LAB — SARS CORONAVIRUS 2 (TAT 6-24 HRS): SARS Coronavirus 2: NEGATIVE

## 2020-06-06 NOTE — Anesthesia Preprocedure Evaluation (Addendum)
Anesthesia Evaluation  Patient identified by MRN, date of birth, ID band Patient awake    Reviewed: Allergy & Precautions, NPO status , Patient's Chart, lab work & pertinent test results  Airway Mallampati: II  TM Distance: >3 FB Neck ROM: Full    Dental no notable dental hx. (+) Teeth Intact, Dental Advisory Given   Pulmonary    Pulmonary exam normal breath sounds clear to auscultation       Cardiovascular hypertension, Pt. on medications and Pt. on home beta blockers Normal cardiovascular exam+ dysrhythmias  Rhythm:Regular Rate:Normal     Neuro/Psych negative neurological ROS     GI/Hepatic Neg liver ROS, GERD  Medicated and Poorly Controlled,  Endo/Other    Renal/GU negative Renal ROS     Musculoskeletal   Abdominal (+) + obese,   Peds  Hematology   Anesthesia Other Findings   Reproductive/Obstetrics                           Anesthesia Physical Anesthesia Plan  ASA: III  Anesthesia Plan: MAC   Post-op Pain Management:    Induction:   PONV Risk Score and Plan: Treatment may vary due to age or medical condition  Airway Management Planned: Natural Airway  Additional Equipment: None  Intra-op Plan:   Post-operative Plan:   Informed Consent: I have reviewed the patients History and Physical, chart, labs and discussed the procedure including the risks, benefits and alternatives for the proposed anesthesia with the patient or authorized representative who has indicated his/her understanding and acceptance.     Interpreter used for [employer] and Engineer, production given  Plan Discussed with: CRNA  Anesthesia Plan Comments: (Duodenal polyp for colonoscopy)       Anesthesia Quick Evaluation

## 2020-06-07 ENCOUNTER — Encounter (HOSPITAL_COMMUNITY)
Admission: RE | Disposition: A | Discharge status: Home / Self Care | Payer: Self-pay | Source: Home / Self Care | Attending: Gastroenterology

## 2020-06-07 ENCOUNTER — Other Ambulatory Visit: Payer: Self-pay

## 2020-06-07 ENCOUNTER — Ambulatory Visit (HOSPITAL_COMMUNITY): Payer: Managed Care, Other (non HMO) | Admitting: Certified Registered"

## 2020-06-07 ENCOUNTER — Ambulatory Visit (HOSPITAL_COMMUNITY)
Admission: RE | Admit: 2020-06-07 | Discharge: 2020-06-07 | Disposition: A | Discharge status: Home / Self Care | Payer: Managed Care, Other (non HMO) | Attending: Gastroenterology | Admitting: Gastroenterology

## 2020-06-07 ENCOUNTER — Encounter (HOSPITAL_COMMUNITY): Payer: Self-pay | Admitting: Gastroenterology

## 2020-06-07 DIAGNOSIS — I10 Essential (primary) hypertension: Secondary | ICD-10-CM | POA: Diagnosis not present

## 2020-06-07 DIAGNOSIS — K317 Polyp of stomach and duodenum: Secondary | ICD-10-CM | POA: Diagnosis not present

## 2020-06-07 DIAGNOSIS — K2289 Other specified disease of esophagus: Secondary | ICD-10-CM | POA: Diagnosis not present

## 2020-06-07 DIAGNOSIS — K219 Gastro-esophageal reflux disease without esophagitis: Secondary | ICD-10-CM | POA: Insufficient documentation

## 2020-06-07 DIAGNOSIS — K449 Diaphragmatic hernia without obstruction or gangrene: Secondary | ICD-10-CM | POA: Diagnosis not present

## 2020-06-07 DIAGNOSIS — D132 Benign neoplasm of duodenum: Secondary | ICD-10-CM

## 2020-06-07 HISTORY — DX: Essential (primary) hypertension: I10

## 2020-06-07 HISTORY — DX: Ventricular premature depolarization: I49.3

## 2020-06-07 HISTORY — PX: BIOPSY: SHX5522

## 2020-06-07 HISTORY — PX: ESOPHAGOGASTRODUODENOSCOPY (EGD) WITH PROPOFOL: SHX5813

## 2020-06-07 SURGERY — ESOPHAGOGASTRODUODENOSCOPY (EGD) WITH PROPOFOL
Anesthesia: Monitor Anesthesia Care

## 2020-06-07 MED ORDER — PROPOFOL 10 MG/ML IV BOLUS
INTRAVENOUS | Status: AC
  Filled 2020-06-07: qty 20

## 2020-06-07 MED ORDER — SODIUM CHLORIDE 0.9 % IV SOLN: INTRAVENOUS | Status: DC

## 2020-06-07 MED ORDER — PROPOFOL 500 MG/50ML IV EMUL
INTRAVENOUS | Status: DC | PRN
  Administered 2020-06-07: 125 ug/kg/min via INTRAVENOUS

## 2020-06-07 MED ORDER — LIDOCAINE 2% (20 MG/ML) 5 ML SYRINGE
INTRAMUSCULAR | Status: DC | PRN
  Administered 2020-06-07: 50 mg via INTRAVENOUS

## 2020-06-07 MED ORDER — OMEPRAZOLE 20 MG PO CPDR
40.0000 mg | DELAYED_RELEASE_CAPSULE | Freq: Two times a day (BID) | ORAL | 3 refills | Status: DC
Start: 2020-06-07 — End: 2020-09-22

## 2020-06-07 MED ORDER — OMEPRAZOLE 20 MG PO CPDR
40.0000 mg | DELAYED_RELEASE_CAPSULE | Freq: Two times a day (BID) | ORAL | 3 refills | Status: DC
Start: 2020-06-07 — End: 2020-06-07

## 2020-06-07 MED ORDER — PROPOFOL 10 MG/ML IV BOLUS
INTRAVENOUS | Status: DC | PRN
  Administered 2020-06-07: 20 mg via INTRAVENOUS

## 2020-06-07 MED ORDER — LACTATED RINGERS IV SOLN
INTRAVENOUS | Status: DC
  Administered 2020-06-07: 1000 mL via INTRAVENOUS

## 2020-06-07 SURGICAL SUPPLY — 14 items

## 2020-06-07 NOTE — Transfer of Care (Signed)
Immediate Anesthesia Transfer of Care Note  Patient: Eddie Lopez  Procedure(s) Performed: ESOPHAGOGASTRODUODENOSCOPY (EGD) WITH PROPOFOL (N/A ) BIOPSY  Patient Location: PACU and Endoscopy Unit  Anesthesia Type:MAC  Level of Consciousness: awake, alert  and patient cooperative  Airway & Oxygen Therapy: Patient Spontanous Breathing and Patient connected to face mask oxygen  Post-op Assessment: Report given to RN and Post -op Vital signs reviewed and stable  Post vital signs: Reviewed and stable  Last Vitals:  Vitals Value Taken Time  BP 105/89 06/07/20 1232  Temp 36.7 C 06/07/20 1232  Pulse 63 06/07/20 1233  Resp 14 06/07/20 1233  SpO2 100 % 06/07/20 1233  Vitals shown include unvalidated device data.  Last Pain:  Vitals:   06/07/20 1232  TempSrc: Oral  PainSc: 0-No pain         Complications: No complications documented.

## 2020-06-07 NOTE — Anesthesia Postprocedure Evaluation (Signed)
Anesthesia Post Note  Patient: Eddie Lopez  Procedure(s) Performed: ESOPHAGOGASTRODUODENOSCOPY (EGD) WITH PROPOFOL (N/A ) BIOPSY     Patient location during evaluation: Endoscopy Anesthesia Type: MAC Level of consciousness: awake and alert Pain management: pain level controlled Vital Signs Assessment: post-procedure vital signs reviewed and stable Respiratory status: spontaneous breathing, nonlabored ventilation, respiratory function stable and patient connected to nasal cannula oxygen Cardiovascular status: blood pressure returned to baseline and stable Postop Assessment: no apparent nausea or vomiting Anesthetic complications: no   No complications documented.  Last Vitals:  Vitals:   06/07/20 1240 06/07/20 1251  BP: 115/61 139/76  Pulse: (!) 58 (!) 53  Resp: 13 12  Temp:    SpO2: 97% 98%    Last Pain:  Vitals:   06/07/20 1251  TempSrc:   PainSc: 0-No pain                 Barnet Glasgow

## 2020-06-07 NOTE — Op Note (Signed)
[facility] Patient Name: Eddie Lopez Procedure Date: 06/07/2020 MRN: [id number] Attending MD: [name] , MD Date of Birth: 01-18-1981 CSN: [id number] Age: 40 Admit Type: Outpatient Procedure:                Upper GI endoscopy Indications:              Diagnostic procedure, Polyp in the duodenum and for                            therapy of polyp in the duodenum (previous biopsy                            negative for adenomatous tissue) Providers:                [name], MD, Burtis Junes, RN, Benetta Spar, Technician, Jefm Miles CRNA Referring MD:             Lennette Bihari. Bonna Gains MD, MD, Angela Adam. [place] Medicines:                Monitored Anesthesia Care Complications:            No immediate complications. Estimated Blood Loss:     Estimated blood loss was minimal. Procedure:                Pre-Anesthesia Assessment:                           - Prior to the procedure, a History and Physical                            was performed, and patient medications and                            allergies were reviewed. The patient's tolerance of                            previous anesthesia was also reviewed. The risks                            and benefits of the procedure and the sedation                            options and risks were discussed with the patient.                            All questions were answered, and informed consent                            was obtained. Prior Anticoagulants: The patient has                            taken no previous anticoagulant or antiplatelet  agents. ASA Grade Assessment: II - A patient with                            mild systemic disease. After reviewing the risks                            and benefits, the patient was deemed in                            satisfactory condition to undergo the procedure.                           After  obtaining informed consent, the endoscope was                            passed under direct vision. Throughout the                            procedure, the patient's blood pressure, pulse, and                            oxygen saturations were monitored continuously. The                            [id number]-H190 ([id number]) Olympus gastroscope was                            introduced through the mouth, and advanced to the                            second part of duodenum. The ERCP [id number] was                            introduced through the mouth, and advanced to the                            second part of duodenum. The upper GI endoscopy was                            accomplished without difficulty. The patient                            tolerated the procedure. Scope In: Scope Out: Findings:      Mucosal changes including ringed esophagus, longitudinal furrows and       crepe paper esophagus were found in the entire esophagus. Esophageal       findings were graded using the Eosinophilic Esophagitis Endoscopic       Reference Score (EoE-EREFS) as: Edema Grade 1 Present (decreased clarity       or absence of vascular markings), Rings Grade 1 Mild (subtle       circumferential ridges seen on esophageal distension), Exudates Grade 0       None (no white lesions seen), Furrows Grade 1 Present (vertical lines       with or without  visible depth) and Stricture present. Biopsies were       obtained from the proximal and middle esophagus with cold forceps for       histology of suspected eosinophilic esophagitis. Biopsies were obtained       from the distal esophagus with cold forceps for histology of suspected       eosinophilic esophagitis.      The Z-line was irregular and was found 40 cm from the incisors.      A 2 cm hiatal hernia was present.      No gross lesions were noted in the entire examined stomach.      A single 9 mm mucosal prominence of the minor papilla was found.        Biopsies were taken with a cold forceps for histology to rule out       adenomatous change.      A single 14 mm mucosal prominence of the major papilla with tail       extension was found. Biopsies were taken with a cold forceps for       histology to rule out adenomatous change.      No other gross lesions were noted in the duodenal bulb, in the first       portion of the duodenum and in the second portion of the duodenum. Impression:               - Esophageal mucosal changes suggestive of                            eosinophilic esophagitis. Biopsied.                           - Z-line irregular, 40 cm from the incisors.                           - 2 cm hiatal hernia.                           - No gross lesions in the stomach.                           - Mucosal prominence of the minor papilla was found                            - biopsied.                           - Mucosal prominence of the major papilla with tail                            extension was found - biopsied.                           - No other gross lesions in the duodenal bulb, in                            the first portion of the duodenum and in the second  portion of the duodenum. Moderate Sedation:      Not Applicable - Patient had care per Anesthesia. Recommendation:           - The patient will be observed post-procedure,                            until all discharge criteria are met.                           - Discharge patient to home.                           - Patient has a contact number available for                            emergencies. The signs and symptoms of potential                            delayed complications were discussed with the                            patient. Return to normal activities tomorrow.                            Written discharge instructions were provided to the                            patient.                           - Resume  previous diet.                           - Increase to Omeprazole 40 mg twice daily (this                            will be important to differentiate PPI-rEoE vs                            PPI-nrEoE) - previous plan by Dr. Bonna Gains.                           - Observe patient's clinical course.                           - Await pathology results.                           - If the major papilla or minor papilla have true                            adenomatous change or dysplasia, then patient will                            need consideration of Major/Minor Ampullectomy. For  this to occur, a MRI/MRCP would need to be                            performed and discussion in clinic for                            consideration of resection endoscopically would be                            done. At which point an EUS-ERCP combination would                            be performed. If the tissue is non-adenomatous,                            then no further evaluation will be necessary and                            resection of these areas of tissue would not be                            required.                           - The findings and recommendations were discussed                            with the patient.                           - The findings and recommendations were discussed                            with the patient's family. Procedure Code(s):        --- Professional ---                           [phone], Esophagogastroduodenoscopy, flexible,                            transoral; with biopsy, single or multiple Diagnosis Code(s):        --- Professional ---                           K22.8, Other specified diseases of esophagus                           K44.9, Diaphragmatic hernia without obstruction or                            gangrene                           K31.89, Other diseases of stomach and duodenum  K31.7,  Polyp of stomach and duodenum CPT copyright 2019 [employer]. All rights reserved. The codes documented in this report are preliminary and upon coder review may  be revised to meet current compliance requirements. [name], MD 06/07/2020 12:46:05 PM Number of Addenda: 0

## 2020-06-07 NOTE — Anesthesia Procedure Notes (Signed)
Procedure Name: MAC Date/Time: 06/07/2020 11:45 AM Performed by: Eben Burow, CRNA Pre-anesthesia Checklist: Patient identified, Emergency Drugs available, Suction available, Patient being monitored and Timeout performed Oxygen Delivery Method: Simple face mask Placement Confirmation: positive ETCO2

## 2020-06-07 NOTE — Discharge Instructions (Signed)
YOU HAD AN ENDOSCOPIC PROCEDURE TODAY: Refer to the procedure report and other information in the discharge instructions given to you for any specific questions about what was found during the examination. If this information does not answer your questions, please call [place] office at [phone] to clarify.   YOU SHOULD EXPECT: Some feelings of bloating in the abdomen. Passage of more gas than usual. Walking can help get rid of the air that was put into your GI tract during the procedure and reduce the bloating. If you had a lower endoscopy (such as a colonoscopy or flexible sigmoidoscopy) you may notice spotting of blood in your stool or on the toilet paper. Some abdominal soreness may be present for a day or two, also.  DIET: Your first meal following the procedure should be a light meal and then it is ok to progress to your normal diet. A half-sandwich or bowl of soup is an example of a good first meal. Heavy or fried foods are harder to digest and may make you feel nauseous or bloated. Drink plenty of fluids but you should avoid alcoholic beverages for 24 hours. If you had a esophageal dilation, please see attached instructions for diet.    ACTIVITY: Your care partner should take you home directly after the procedure. You should plan to take it easy, moving slowly for the rest of the day. You can resume normal activity the day after the procedure however YOU SHOULD NOT DRIVE, use power tools, machinery or perform tasks that involve climbing or major physical exertion for 24 hours (because of the sedation medicines used during the test).   SYMPTOMS TO REPORT IMMEDIATELY: A gastroenterologist can be reached at any hour. Please call [phone]  for any of the following symptoms:   Following upper endoscopy (EGD, EUS, ERCP, esophageal dilation) Vomiting of blood or coffee ground material  New, significant abdominal pain  New, significant chest pain or pain under the shoulder blades  Painful or  persistently difficult swallowing  New shortness of breath  Black, tarry-looking or red, bloody stools  FOLLOW UP:  If any biopsies were taken you will be contacted by phone or by letter within the next 1-3 weeks. Call [phone]  if you have not heard about the biopsies in 3 weeks.  Please also call with any specific questions about appointments or follow up tests.  

## 2020-06-07 NOTE — H&P (Signed)
GASTROENTEROLOGY PROCEDURE H&P NOTE   Primary Care Physician: [name], MD  HPI: Eddie Lopez is a 40 y.o. male who presents for EGD with EMR of duodenal lesion - query adenoma and rule out not ampulla.  Past Medical History:  Diagnosis Date  . GERD (gastroesophageal reflux disease)   . Hypertension   . Palpitations   . PVC's (premature ventricular contractions)    Past Surgical History:  Procedure Laterality Date  . COLONOSCOPY WITH PROPOFOL N/A 03/12/2020   Procedure: COLONOSCOPY WITH PROPOFOL;  Surgeon: Virgel Manifold, MD;  Location: [facility] ENDOSCOPY;  Service: Endoscopy;  Laterality: N/A;  . ESOPHAGOGASTRODUODENOSCOPY (EGD) WITH PROPOFOL N/A 03/09/2017   Procedure: ESOPHAGOGASTRODUODENOSCOPY (EGD) WITH PROPOFOL;  Surgeon: Virgel Manifold, MD;  Location: [facility] ENDOSCOPY;  Service: Endoscopy;  Laterality: N/A;  . ESOPHAGOGASTRODUODENOSCOPY (EGD) WITH PROPOFOL N/A 03/12/2020   Procedure: ESOPHAGOGASTRODUODENOSCOPY (EGD) WITH PROPOFOL;  Surgeon: Virgel Manifold, MD;  Location: [facility] ENDOSCOPY;  Service: Endoscopy;  Laterality: N/A;   Current Facility-Administered Medications  Medication Dose Route Frequency Provider Last Rate Last Admin  . 0.9 %  sodium chloride infusion   Intravenous Continuous Mansouraty, Telford Nab., MD       No Known Allergies Family History  Problem Relation Age of Onset  . Throat cancer Maternal Grandfather   . Cancer Maternal Grandmother   . Cancer Paternal Grandmother    Social History   Socioeconomic History  . Marital status: Married    Spouse name: Not on file  . Number of children: Not on file  . Years of education: Not on file  . Highest education level: Not on file  Occupational History  . Not on file  Tobacco Use  . Smoking status: Never Smoker  . Smokeless tobacco: Never Used  Vaping Use  . Vaping Use: Never used  Substance and Sexual Activity  . Alcohol use: Yes    Alcohol/week: 0.0 standard drinks     Comment: 1 beer a week at most weekly  . Drug use: No  . Sexual activity: Yes  Other Topics Concern  . Not on file  Social History Narrative  . Not on file   Social Determinants of Health   Financial Resource Strain: Not on file  Food Insecurity: Not on file  Transportation Needs: Not on file  Physical Activity: Not on file  Stress: Not on file  Social Connections: Not on file  Intimate Partner Violence: Not on file    Physical Exam: Vital signs in last 24 hours:     GEN: NAD EYE: Sclerae anicteric ENT: MMM CV: Non-tachycardic GI: Soft, NT/ND NEURO:  Alert & Oriented x 3  Lab Results: No results for input(s): WBC, HGB, HCT, PLT in the last 72 hours. BMET No results for input(s): NA, K, CL, CO2, GLUCOSE, BUN, CREATININE, CALCIUM in the last 72 hours. LFT No results for input(s): PROT, ALBUMIN, AST, ALT, ALKPHOS, BILITOT, BILIDIR, IBILI in the last 72 hours. PT/INR No results for input(s): LABPROT, INR in the last 72 hours.   Impression / Plan: This is a 40 y.o.male who presents for EGD with EMR of duodenal lesion - query adenoma and rule out not ampulla.  Based upon the description and endoscopic pictures I do feel that it is reasonable to pursue an Advanced Polypectomy attempt of the polyp/lesion.  We discussed some of the techniques of advanced polypectomy which include Endoscopic Mucosal Resection, OVESCO Full-Thickness Resection, Endorotor Morcellation, and Tissue Ablation via Fulguration.  We also reviewed images of  typical techniques as noted above.  The risks and benefits of endoscopic evaluation were discussed with the patient; these include but are not limited to the risk of perforation, infection, bleeding, missed lesions, lack of diagnosis, severe illness requiring hospitalization, as well as anesthesia and sedation related illnesses.  During attempts at advanced resection, the risks of bleeding and perforation/leak are increased as opposed to diagnostic and  screening procedures, and that was discussed with the patient as well.   In addition, I explained that with the possible need for piecemeal resection, subsequent short-interval endoscopic evaluation for follow up and potential retreatment of the lesion/area may be necessary. If, after attempt at removal of the polyp/lesion, it is found that the patient has a complication or that an invasive lesion or malignant lesion is found, or that the polyp/lesion continues to recur, the patient is aware and understands that surgery may still be indicated/required.   The risks and benefits of endoscopic evaluation were discussed with the patient; these include but are not limited to the risk of perforation, infection, bleeding, missed lesions, lack of diagnosis, severe illness requiring hospitalization, as well as anesthesia and sedation related illnesses.  The patient is agreeable to proceed.    [name], MD [facility] Advanced [facility]oscopy Office # [phone]

## 2020-06-08 LAB — SURGICAL PATHOLOGY

## 2020-06-09 ENCOUNTER — Encounter (HOSPITAL_COMMUNITY): Payer: Self-pay | Admitting: Gastroenterology

## 2020-06-11 ENCOUNTER — Encounter: Payer: Self-pay | Admitting: Gastroenterology

## 2020-06-15 ENCOUNTER — Telehealth: Payer: Managed Care, Other (non HMO) | Admitting: Gastroenterology

## 2020-06-15 ENCOUNTER — Ambulatory Visit (INDEPENDENT_AMBULATORY_CARE_PROVIDER_SITE_OTHER): Payer: Managed Care, Other (non HMO) | Admitting: Gastroenterology

## 2020-06-15 ENCOUNTER — Encounter: Payer: Self-pay | Admitting: Gastroenterology

## 2020-06-15 VITALS — BP 142/87 | HR 62 | Temp 97.5°F | Ht 73.0 in | Wt 271.0 lb

## 2020-06-15 DIAGNOSIS — K219 Gastro-esophageal reflux disease without esophagitis: Secondary | ICD-10-CM | POA: Diagnosis not present

## 2020-06-15 DIAGNOSIS — K200 Eosinophilic esophagitis: Secondary | ICD-10-CM | POA: Diagnosis not present

## 2020-06-15 NOTE — Progress Notes (Signed)
Eddie Antigua, MD [address]  [place]  [place], [place] [address]  Main: [phone]  Fax: [phone]   Primary Care Physician: [name], MD   CC: Reflux  HPI: Eddie Lopez is a 40 y.o. male with previous history of eosinophilic esophagitis here for follow-up.  His most recent upper endoscopy with Dr. Rush Landmark which was done for evaluation of a duodenal lesion, with biopsies done during the procedure proved it to be benign normal major and minor papilla.  No precancerous tissue was present.  Esophageal biopsies do show improvement in the number of eosinophils in his proximal distal esophagus compared to January 2022.  Patient is on PPI twice daily and was placed on omeprazole 40 mg twice daily after his January 2022 procedure but he states insurance was not covering it so he was only taking omeprazole 20 mg twice daily.  However, since his March 2022 procedure with Dr. Rush Landmark, he was able to obtain omeprazole 40mg  BID dosing with his insurance.  He absolutely denies having any dysphagia in his lifetime, and he was previously diagnosed with EOE even in [facility].  He also was placed on swallowed steroids and he states he took it for about a month and since it did not help with his symptoms, it was discontinued.  His main complaint continues to be heartburn, specifically when laying down and he states sometimes it has been not severe that it wakes him up.  However, since being on omeprazole 40 mg twice daily he has not had any further breakthrough symptoms at all.  On further questioning, and asking what is the worst symptom he has ever had, he describes being at a beach about 2 to 3 years ago and again he woke up at that time with severe heartburn and had to stay awake for 2 hours before it subsided.  Even at that time he never actually ever had dysphagia.  Previous history: Onpreviousvisits, we also noted that his May 2018 labs showed  mildly elevated bilirubin chronically. Liver work-up, and abdominal ultrasound were ordered. Hepatic steatosis was reported.Blood work was negative for viral and autoimmune hepatitis. 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 His immunization records show that he was immunized to hepatitis a and B in the past.  The ultrasound also reported splenomegaly with spleen measuring 14.8 cm. He was referred to hematology by his primary care provider. And they have ordered work-up which has been unrevealing. Dr. Grayland Ormond, wanted to obtain a CT scan of his abdomen, but was not approved by insurance.An MRI was done instead and it is available under imaging, 10/12/2017, labeled "ultrasound outside films body" and reports a normal spleen.  His EGD on March 09, 2017, showed endoscopic features of eosinophilic esophagitis. Pathology report did not show any eosinophils in the distal or proximal esophagus, and reported changes compatible with reflux. Stomach biopsies showed mild chronic gastritis, negative for H. pylori. A small island of salmon colored mucosa and biopsies showed reflux gastroesophagitis, negative for goblet cells, dysplasia and malignancy.   2011 pathology (see procedures tab) showed increased eosinophils at GE junction, distal and proximal esophagus  As per previous notes: Reportedly had an EGD 4-5 yrs ago for acid reflux.He did not have any symptoms of food impaction at that time or now. EGD reportedly showed eosinophilic esophagitis at the time. He did not have any reports or pathology reports from this procedure. He was started on PPI at that time  and then transitioned over to a swallowed steroid. He states the PPI helped with the swallowed steroid did not. Over time he stopped taking both medications. In June or July of this year his heartburn returned and his primary care provider prescribed Prilosec 40 mg daily  in the morning. This has helped his daytime symptoms but he is continuing to have heartburn at night.  Current Outpatient Medications  Medication Sig Dispense Refill  . nebivolol (BYSTOLIC) 2.5 MG tablet Take 2.5 mg by mouth daily.    Marland Kitchen omeprazole (PRILOSEC) 20 MG capsule Take 2 capsules (40 mg total) by mouth 2 (two) times daily before a meal. 60 capsule 3   No current facility-administered medications for this visit.    Allergies as of 06/15/2020  . (No Known Allergies)    ROS:  General: Negative for anorexia, weight loss, fever, chills, fatigue, weakness. ENT: Negative for hoarseness, difficulty swallowing , nasal congestion. CV: Negative for chest pain, angina, palpitations, dyspnea on exertion, peripheral edema.  Respiratory: Negative for dyspnea at rest, dyspnea on exertion, cough, sputum, wheezing.  GI: See history of present illness. GU:  Negative for dysuria, hematuria, urinary incontinence, urinary frequency, nocturnal urination.  Endo: Negative for unusual weight change.    Physical Examination:   BP (!) 142/87   Pulse 62   Temp (!) 97.5 F (36.4 C) (Oral)   Ht 6\' 1"  (1.854 m)   Wt 271 lb (122.9 kg)   BMI 35.75 kg/m   General: Well-nourished, well-developed in no acute distress.  Eyes: No icterus. Conjunctivae pink. Mouth: Oropharyngeal mucosa moist and pink , no lesions erythema or exudate. Neck: Supple, Trachea midline Abdomen: Bowel sounds are normal, nontender, nondistended, no hepatosplenomegaly or masses, no abdominal bruits or hernia , no rebound or guarding.   Extremities: No lower extremity edema. No clubbing or deformities. Neuro: Alert and oriented x 3.  Grossly intact. Skin: Warm and dry, no jaundice.   Psych: Alert and cooperative, normal mood and affect.   Labs: CMP     Component Value Date/Time   NA 138 10/13/2019 0849   NA 142 04/23/2017 0815   K 4.3 10/13/2019 0849   CL 102 10/13/2019 0849   CO2 26 10/13/2019 0849   GLUCOSE 94  10/13/2019 0849   BUN 13 10/13/2019 0849   BUN 13 04/23/2017 0815   CREATININE 1.01 10/13/2019 0849   CREATININE 0.85 10/04/2018 1412   CALCIUM 9.9 10/13/2019 0849   PROT 7.1 11/17/2019 0926   PROT 7.1 04/23/2017 0815   ALBUMIN 4.7 11/17/2019 0926   ALBUMIN 4.4 04/23/2017 0815   AST 27 11/17/2019 0926   ALT 46 11/17/2019 0926   ALKPHOS 78 11/17/2019 0926   BILITOT 1.4 (H) 11/17/2019 0926   BILITOT 0.8 04/23/2017 0815   GFRNONAA 96 04/23/2017 0815   GFRAA 111 04/23/2017 0815   Lab Results  Component Value Date   WBC 4.2 05/16/2017   HGB 16.4 05/16/2017   HCT 46.5 05/16/2017   MCV 83 05/16/2017   PLT 205 05/16/2017    Imaging Studies: No results found.  Assessment and Plan:   Josuha Fontanez is a 40 y.o. y/o male here for follow-up of EOE and GERD  Since this patient has never actually had true dysphagia, or any food impactions, and he specifically states that his symptoms did not change with the fluticasone he was prescribed when he was diagnosed with it in [facility], changing his medications from PPI twice daily which is now finally controlling his  reflux symptoms well, to fluticasone may not be the next best step at this point  He is in clinical remission as he is not having any more symptoms, as far as dysphagia  Not having any further reflux symptoms either with omeprazole 40 mg p.o. twice daily  He is not in histologic remission based on most recent biopsies, but they do show improvement from January 2022 even though he was only on half the dose omeprazole he is on now since March 2022  We discussed several options in detail with the patient, including changing to swallowed steroid, continued PPI, allergy and allergy testing, 6 food elimination diet  After discussing several options, with our shared decision-making, we will refer patient to allergy immunology to assess if there are certain food groups that he can eliminate which could help with  decreasing his eosinophils in his esophagus.   Again, changing therapy from PPI to fluticasone may worsen his previously uncontrolled reflux again, without any benefit in regard to his previously nonexistent symptoms from EOE itself  However, if symptoms change, swallowed steroid will need to definitely be considered  Patient agreeable with above plan  Patient continues to work on weight loss with diet and exercise and this was encouraged  Continue follow-up in clinic in 2 to 3 months or earlier if symptoms change  Patient educated extensively on acid reflux lifestyle modification, including buying a bed wedge, not eating 3 hrs before bedtime, diet modifications, and handout given for the same.    Dr Eddie Lopez

## 2020-06-15 NOTE — Patient Instructions (Signed)
We will send your referral to the Allergy clinic. They will be contacting in a week or less.

## 2020-09-22 ENCOUNTER — Ambulatory Visit (INDEPENDENT_AMBULATORY_CARE_PROVIDER_SITE_OTHER): Payer: Managed Care, Other (non HMO) | Admitting: Gastroenterology

## 2020-09-22 ENCOUNTER — Other Ambulatory Visit: Payer: Self-pay

## 2020-09-22 VITALS — BP 103/74 | HR 64 | Temp 98.7°F | Ht 73.0 in | Wt 271.0 lb

## 2020-09-22 DIAGNOSIS — K219 Gastro-esophageal reflux disease without esophagitis: Secondary | ICD-10-CM | POA: Diagnosis not present

## 2020-09-22 DIAGNOSIS — K200 Eosinophilic esophagitis: Secondary | ICD-10-CM | POA: Diagnosis not present

## 2020-09-22 MED ORDER — PANTOPRAZOLE SODIUM 40 MG PO TBEC
40.0000 mg | DELAYED_RELEASE_TABLET | Freq: Two times a day (BID) | ORAL | 0 refills | Status: DC
Start: 2020-09-22 — End: 2020-10-04

## 2020-09-22 NOTE — Progress Notes (Signed)
Eddie Antigua, MD [address]  [place]  [place], [place] [address]  Main: [phone]  Fax: [phone]   Primary Care Physician: [name], MD   Chief Complaint  Patient presents with   Follow-up    3 month... still having intermittent burning sensation... has tried to sleep on the wedge    HPI: Eddie Lopez is a 40 y.o. male here for follow-up of GERD and EOE.  On last visit patient was referred for allergy immunology testing.  Their clinic note is available in Care Everywhere that states that they will proceed with allergy testing, but the results of this testing is not available to [place].  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  He denies any episodes of dysphagia and he has never had dysphagia.  His main symptoms have always been reflux based.  He continues to have reflux specifically at night, which wakes him up at night despite taking omeprazole twice daily.  He he is currently taking omeprazole 40 mg in the evening, and in the mornings he is currently taking 20 mg, but was previously taking 40 mg in the mornings.  He is trying to decrease the dose to the lowest dose possible and that is why he decrease the morning dose himself.  He has a 2 cm hiatal hernia noted on upper endoscopy.  See Dr. Donneta Romberg recent endoscopy report as well  Previous history: His most recent upper endoscopy with Dr. Rush Landmark which was done for evaluation of a duodenal lesion, with biopsies done during the procedure proved it to be benign normal major and minor papilla.  No precancerous tissue was present.  Esophageal biopsies do show improvement in the number of eosinophils in his proximal distal esophagus compared to January 2022.  On previous visits, we also noted that his May 2018 labs showed mildly elevated bilirubin chronically. Liver work-up, and  abdominal ultrasound were ordered.  Hepatic steatosis was reported.  Blood work was negative for viral and autoimmune hepatitis.  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 His immunization records show that he was immunized to hepatitis a and B in the past.   The ultrasound also reported splenomegaly with spleen measuring 14.8 cm.  He was referred to hematology by his primary care provider.  And they have ordered work-up which has been unrevealing.  Dr. Grayland Ormond, wanted to obtain a CT scan of his abdomen, but was not approved by insurance.  An MRI was done instead and it is available under imaging, 10/12/2017, labeled "ultrasound outside films body" and reports a normal spleen.   His EGD on March 09, 2017, showed endoscopic features of eosinophilic esophagitis.  Pathology report did not show any eosinophils in the distal or proximal esophagus, and reported changes compatible with reflux.  Stomach biopsies showed mild chronic gastritis, negative for H. pylori.  A small island of salmon colored mucosa and biopsies showed reflux gastroesophagitis, negative for goblet cells, dysplasia and malignancy.    2011 pathology (see procedures tab) showed increased eosinophils at GE junction, distal and proximal esophagus   As per previous notes: Reportedly had an EGD 4-5 yrs ago for acid reflux.  He did not have any symptoms of food impaction at that time or now.  EGD reportedly showed eosinophilic esophagitis at the time.  He did  not have any reports or pathology reports from this procedure.  He was started on PPI at that time and then transitioned over to a swallowed steroid.  He states the PPI helped with the swallowed steroid did not.  Over time he stopped taking both medications.  In June or July of this year his heartburn returned and his primary care provider prescribed Prilosec 40 mg daily in the morning.  This has helped his daytime symptoms but  he is continuing to have heartburn at night.  Since this patient has never actually had true dysphagia, or any food impactions, and he specifically states that his symptoms did not change with the fluticasone he was prescribed when he was diagnosed with it in [facility] changing his medications from PPI twice daily which is now finally controlling his reflux symptoms well, to fluticasone may not be the next best step at this point  He is in clinical remission as he is not having any more symptoms, as far as dysphagia  Not having any further reflux symptoms either with omeprazole 40 mg p.o. twice daily  Discussion of treatment plan on April 2022:  He is not in histologic remission based on most recent biopsies, but they do show improvement from January 2022 even though he was only on half the dose omeprazole he is on now since March 2022  We discussed several options in detail with the patient, including changing to swallowed steroid, continued PPI, allergy and allergy testing, 6 food elimination diet  After discussing several options, with our shared decision-making, we will refer patient to allergy immunology to assess if there are certain food groups that he can eliminate which could help with decreasing his eosinophils in his esophagus.   Again, changing therapy from PPI to fluticasone may worsen his previously uncontrolled reflux again, without any benefit in regard to his previously nonexistent symptoms from EOE itself  However, if symptoms change, swallowed steroid will need to definitely be considered  ROS: All ROS reviewed and negative except as per HPI   Past Medical History:  Diagnosis Date   GERD (gastroesophageal reflux disease)    Hypertension    Palpitations    PVC's (premature ventricular contractions)     Past Surgical History:  Procedure Laterality Date   BIOPSY  06/07/2020   Procedure: BIOPSY;  Surgeon: Irving Copas., MD;  Location: [facility];  Service: Gastroenterology;;   COLONOSCOPY WITH PROPOFOL N/A 03/12/2020   Procedure: COLONOSCOPY WITH PROPOFOL;  Surgeon: Virgel Manifold, MD;  Location: [facility] ENDOSCOPY;  Service: Endoscopy;  Laterality: N/A;   ESOPHAGOGASTRODUODENOSCOPY (EGD) WITH PROPOFOL N/A 03/09/2017   Procedure: ESOPHAGOGASTRODUODENOSCOPY (EGD) WITH PROPOFOL;  Surgeon: Virgel Manifold, MD;  Location: [facility] ENDOSCOPY;  Service: Endoscopy;  Laterality: N/A;   ESOPHAGOGASTRODUODENOSCOPY (EGD) WITH PROPOFOL N/A 03/12/2020   Procedure: ESOPHAGOGASTRODUODENOSCOPY (EGD) WITH PROPOFOL;  Surgeon: Virgel Manifold, MD;  Location: [facility] ENDOSCOPY;  Service: Endoscopy;  Laterality: N/A;   ESOPHAGOGASTRODUODENOSCOPY (EGD) WITH PROPOFOL N/A 06/07/2020   Procedure: ESOPHAGOGASTRODUODENOSCOPY (EGD) WITH PROPOFOL;  Surgeon: Rush Landmark Telford Nab., MD;  Location: [facility] ENDOSCOPY;  Service: Gastroenterology;  Laterality: N/A;    Prior to Admission medications   Medication Sig Start Date End Date Taking? Authorizing Provider  nebivolol (BYSTOLIC) 2.5 MG tablet Take 2.5 mg by mouth daily. 08/15/17  Yes [provider]  pantoprazole (PROTONIX) 40 MG tablet Take 1 tablet (40 mg total) by mouth 2 (two) times daily before a meal. 09/22/20  Yes Virgel Manifold, MD  Family History  Problem Relation Age of Onset   Throat cancer Maternal Grandfather    Cancer Maternal Grandmother    Cancer Paternal Grandmother      Social History   Tobacco Use   Smoking status: Never   Smokeless tobacco: Never  Vaping Use   Vaping Use: Never used  Substance Use Topics   Alcohol use: Yes    Alcohol/week: 0.0 standard drinks    Comment: 1 beer a week at most weekly   Drug use: No    Allergies as of 09/22/2020   (No Known Allergies)    Physical Examination:  Constitutional: General:   Alert,  Well-developed, well-nourished, pleasant and cooperative in NAD BP 103/74   Pulse 64   Temp 98.7 F (37.1 C) (Oral)   Ht 6'  1" (1.854 m)   Wt 271 lb (122.9 kg)   BMI 35.75 kg/m   Respiratory: Normal respiratory effort  Gastrointestinal:  Soft, non-tender and non-distended without masses, hepatosplenomegaly or hernias noted.  No guarding or rebound tenderness.     Cardiac: No clubbing or edema.  No cyanosis. Normal posterior tibial pedal pulses noted.  Psych:  Alert and cooperative. Normal mood and affect.  Musculoskeletal:  Normal gait. Head normocephalic, atraumatic. Symmetrical without gross deformities. 5/5 Lower extremity strength bilaterally.  Skin: Warm. Intact without significant lesions or rashes. No jaundice.  Neck: Supple, trachea midline  Lymph: No cervical lymphadenopathy  Psych:  Alert and oriented x3, Alert and cooperative. Normal mood and affect.  Labs: CMP     Component Value Date/Time   NA 138 10/13/2019 0849   NA 142 04/23/2017 0815   K 4.3 10/13/2019 0849   CL 102 10/13/2019 0849   CO2 26 10/13/2019 0849   GLUCOSE 94 10/13/2019 0849   BUN 13 10/13/2019 0849   BUN 13 04/23/2017 0815   CREATININE 1.01 10/13/2019 0849   CREATININE 0.85 10/04/2018 1412   CALCIUM 9.9 10/13/2019 0849   PROT 7.1 11/17/2019 0926   PROT 7.1 04/23/2017 0815   ALBUMIN 4.7 11/17/2019 0926   ALBUMIN 4.4 04/23/2017 0815   AST 27 11/17/2019 0926   ALT 46 11/17/2019 0926   ALKPHOS 78 11/17/2019 0926   BILITOT 1.4 (H) 11/17/2019 0926   BILITOT 0.8 04/23/2017 0815   GFRNONAA 96 04/23/2017 0815   GFRAA 111 04/23/2017 0815   Lab Results  Component Value Date   WBC 4.2 05/16/2017   HGB 16.4 05/16/2017   HCT 46.5 05/16/2017   MCV 83 05/16/2017   PLT 205 05/16/2017    Imaging Studies:   Assessment and Plan:   Lovis More is a 40 y.o. 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  Given ongoing refractory GERD symptoms and the presence of a hiatal hernia,  patient is agreeable to surgical referral for further evaluation  Patient states omeprazole is not covered by his insurance and he is having to buy it over-the-counter.  In addition, since his nighttime GERD symptoms remain uncontrolled, reasonable to try and switch to another PPI.  We will start Protonix 40 mg twice daily at this time  (Risks of PPI use were discussed with patient including bone loss, C. Diff diarrhea, pneumonia, infections, CKD, electrolyte abnormalities.  Pt. Verbalizes understanding and chooses to continue the medication.)  Patient continues to deny any dysphagia.  Colonoscopy surveillance in 5 years from  last colonoscopy discussed given sessile serrated polyp seen on last procedure    Dr Eddie Lopez

## 2020-10-04 ENCOUNTER — Other Ambulatory Visit: Payer: Self-pay | Admitting: Gastroenterology

## 2020-10-04 MED ORDER — ESOMEPRAZOLE MAGNESIUM 40 MG PO CPDR: 40.0000 mg | DELAYED_RELEASE_CAPSULE | Freq: Every day | ORAL | 0 refills | Status: DC

## 2020-10-13 ENCOUNTER — Encounter: Payer: Self-pay | Admitting: Family Medicine

## 2020-10-13 ENCOUNTER — Other Ambulatory Visit: Payer: Self-pay

## 2020-10-13 ENCOUNTER — Ambulatory Visit (INDEPENDENT_AMBULATORY_CARE_PROVIDER_SITE_OTHER): Payer: Managed Care, Other (non HMO) | Admitting: Family Medicine

## 2020-10-13 VITALS — BP 118/80 | HR 58 | Temp 98.2°F | Ht 74.0 in | Wt 267.8 lb

## 2020-10-13 DIAGNOSIS — E669 Obesity, unspecified: Secondary | ICD-10-CM | POA: Diagnosis not present

## 2020-10-13 DIAGNOSIS — Z0000 Encounter for general adult medical examination without abnormal findings: Secondary | ICD-10-CM | POA: Diagnosis not present

## 2020-10-13 DIAGNOSIS — Z13220 Encounter for screening for lipoid disorders: Secondary | ICD-10-CM | POA: Diagnosis not present

## 2020-10-13 LAB — COMPREHENSIVE METABOLIC PANEL
ALT: 24 U/L (ref 0–53)
AST: 19 U/L (ref 0–37)
Albumin: 4.3 g/dL (ref 3.5–5.2)
Alkaline Phosphatase: 78 U/L (ref 39–117)
BUN: 16 mg/dL (ref 6–23)
CO2: 27 mEq/L (ref 19–32)
Calcium: 9.6 mg/dL (ref 8.4–10.5)
Chloride: 101 mEq/L (ref 96–112)
Creatinine, Ser: 1.15 mg/dL (ref 0.40–1.50)
GFR: 79.16 mL/min (ref >60.00)
Glucose, Bld: 87 mg/dL (ref 70–99)
Potassium: 4.2 mEq/L (ref 3.5–5.1)
Sodium: 138 mEq/L (ref 135–145)
Total Bilirubin: 1.1 mg/dL (ref 0.2–1.2)
Total Protein: 7.1 g/dL (ref 6.0–8.3)

## 2020-10-13 LAB — LIPID PANEL
Cholesterol: 156 mg/dL (ref 0–200)
HDL: 35.1 mg/dL — ABNORMAL LOW (ref >39.00)
NonHDL: 120.54
Total CHOL/HDL Ratio: 4
Triglycerides: 230 mg/dL — ABNORMAL HIGH (ref 0.0–149.0)
VLDL: 46 mg/dL — ABNORMAL HIGH (ref 0.0–40.0)

## 2020-10-13 LAB — HEMOGLOBIN A1C: Hgb A1c MFr Bld: 5.2 % (ref 4.6–6.5)

## 2020-10-13 LAB — LDL CHOLESTEROL, DIRECT: Direct LDL: 84 mg/dL

## 2020-10-13 NOTE — Assessment & Plan Note (Addendum)
Physical exam completed.  He will work on reducing his portion sizes at night and also increasing his physical activity.  The goal here would be weight loss as this would help long-term with his reflux as well as prevention of other medical problems that could develop.  He will continue to follow with his GI physician regarding his reflux.  He will have his flu shot through work.  Lab work as outlined.

## 2020-10-13 NOTE — Progress Notes (Signed)
[name], MD Phone: [phone]  Eddie Lopez is a 40 y.o. male who presents today for CPE.  Diet: meall replacement shakes for breakfast and lunch, normal dinner, working on cutting down on portion sizes Exercise: walks, more active restoring a truck Colonoscopy: 03/12/20, 5 year recall Prostate cancer screening: not indicated Family history-  Prostate cancer: no  Colon cancer: no Vaccines-   Flu: due, he will get this through work in october  Tetanus: UTD  COVID19: x3 HIV screening: UTD Hep C Screening: UTD Tobacco use: no Alcohol use: <0/JWJX Illicit Drug use: no Dentist: yes Ophthalmology: yes GERD: Patient has chronic issues with this.  He follows with GI.  He has been on Nexium nightly as that is when he has his symptoms.  No dysphagia.  No blood in the stool.  He does have a hiatal hernia and he is going to see general surgery for this to get their opinion.   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   Obesity (BMI 35.0-39.9 without comorbidity) 78/46/9629   Eosinophilic esophagitis    Diarrhea    Polyp of colon    Pes anserinus bursitis of left knee 04/19/2020   Resolved Ambulatory Problems    Diagnosis Date Noted   No Resolved Ambulatory Problems   Past Medical History:  Diagnosis Date   Hypertension    Palpitations    PVC's (premature ventricular contractions)     Family History  Problem Relation Age of Onset   Throat cancer Maternal Grandfather    Cancer Maternal Grandmother    Cancer Paternal Grandmother     Social History   Socioeconomic  History   Marital status: Married    Spouse name: Not on file   Number of children: Not on file   Years of education: Not on file   Highest education level: Not on file  Occupational History   Not on file  Tobacco Use   Smoking status: Never   Smokeless tobacco: Never  Vaping Use   Vaping Use: Never used  Substance and Sexual Activity   Alcohol use: Yes    Alcohol/week: 0.0 standard drinks    Comment: 1 beer a week at most weekly   Drug use: No   Sexual activity: Yes  Other Topics Concern   Not on file  Social History Narrative   Not on file   Social Determinants of Health   Financial Resource Strain: Not on file  Food Insecurity: Not on file  Transportation Needs: Not on file  Physical Activity: Not on file  Stress: Not on file  Social Connections: Not on file  Intimate Partner Violence: Not on file    ROS  General:  Negative for nexplained weight loss, fever Skin: Negative for new or changing mole, sore that won't heal HEENT: Negative for trouble hearing, trouble seeing, ringing in ears, mouth sores, hoarseness, change in voice, dysphagia. CV:  Negative for chest pain, dyspnea, edema, palpitations Resp: Negative for cough, dyspnea, hemoptysis GI: Negative for nausea, vomiting, diarrhea, constipation, abdominal pain, melena, hematochezia. GU: Negative for dysuria, incontinence, urinary hesitance, hematuria, vaginal or penile discharge, polyuria, sexual difficulty, lumps  in testicle or breasts MSK: Negative for muscle cramps or aches, joint pain or swelling Neuro: Negative for headaches, weakness, numbness, dizziness, passing out/fainting Psych: Negative for depression, anxiety, memory problems  Objective  Physical Exam Vitals:   10/13/20 0837  BP: 118/80  Pulse: (!) 58  Temp: 98.2 F (36.8 C)  SpO2: 98%    BP Readings from Last 3 Encounters:  10/13/20 118/80  09/22/20 103/74  06/15/20 (!) 142/87   Wt Readings from Last 3 Encounters:  10/13/20 267 lb  12.8 oz (121.5 kg)  09/22/20 271 lb (122.9 kg)  06/15/20 271 lb (122.9 kg)    Physical Exam Constitutional:      General: He is not in acute distress.    Appearance: He is not diaphoretic.  HENT:     Head: Normocephalic and atraumatic.  Eyes:     Conjunctiva/sclera: Conjunctivae normal.     Pupils: Pupils are equal, round, and reactive to light.  Cardiovascular:     Rate and Rhythm: Normal rate and regular rhythm.     Heart sounds: Normal heart sounds.  Pulmonary:     Effort: Pulmonary effort is normal.     Breath sounds: Normal breath sounds.  Abdominal:     General: Bowel sounds are normal. There is no distension.     Palpations: Abdomen is soft.     Tenderness: There is no abdominal tenderness. There is no guarding or rebound.  Musculoskeletal:     Right lower leg: No edema.     Left lower leg: No edema.  Lymphadenopathy:     Cervical: No cervical adenopathy.  Skin:    General: Skin is warm and dry.  Neurological:     Mental Status: He is alert.  Psychiatric:        Mood and Affect: Mood normal.     Assessment/Plan:   Problem List Items Addressed This Visit     Routine general medical examination at a health care facility - Primary    Physical exam completed.  He will work on reducing his portion sizes at night and also increasing his physical activity.  The goal here would be weight loss as this would help long-term with his reflux as well as prevention of other medical problems that could develop.  He will continue to follow with his GI physician regarding his reflux.  He will have his flu shot through work.  Lab work as outlined.       Other Visit Diagnoses     Lipid screening       Relevant Orders   Comp Met (CMET)   Lipid panel   Obesity (BMI 30.0-34.9)       Relevant Orders   HgB A1c       Return in about 1 year (around 10/13/2021) for CPE.  This visit occurred during the SARS-CoV-2 public health emergency.  Safety protocols were in place,  including screening questions prior to the visit, additional usage of staff PPE, and extensive cleaning of exam room while observing appropriate contact time as indicated for disinfecting solutions.    [name], MD [place]

## 2020-10-13 NOTE — Patient Instructions (Signed)
Nice to see you. Please try to increase your activity level with a goal of 150 minutes of moderate physical activity weekly. Please try to decrease your portion sizes at night with the goal to lose weight and help with your reflux. We will get labs and contact you with the results.

## 2020-10-19 ENCOUNTER — Other Ambulatory Visit: Payer: Self-pay | Admitting: Gastroenterology

## 2020-10-19 MED ORDER — DEXLANSOPRAZOLE 30 MG PO CPDR: 60.0000 mg | DELAYED_RELEASE_CAPSULE | Freq: Every day | ORAL | 0 refills | Status: DC

## 2020-10-20 ENCOUNTER — Telehealth: Payer: Self-pay

## 2020-10-20 ENCOUNTER — Telehealth: Payer: Self-pay | Admitting: Family Medicine

## 2020-10-20 NOTE — Telephone Encounter (Signed)
Has been submitted via [email or web address]-- [employer]

## 2020-10-20 NOTE — Telephone Encounter (Signed)
Patient returned office phone call for lab resuls.

## 2020-10-20 NOTE — Telephone Encounter (Signed)
I called and spoke with the patient and informed him of his lab results. Loriene Taunton,cma

## 2020-10-26 MED ORDER — DEXILANT 60 MG PO CPDR: 60.0000 mg | DELAYED_RELEASE_CAPSULE | Freq: Every day | ORAL | 1 refills | Status: DC

## 2020-10-27 ENCOUNTER — Encounter: Payer: Self-pay | Admitting: Family Medicine

## 2020-10-28 MED ORDER — NEBIVOLOL HCL 2.5 MG PO TABS: 2.5000 mg | ORAL_TABLET | Freq: Every day | ORAL | 3 refills | Status: DC

## 2020-11-01 ENCOUNTER — Other Ambulatory Visit: Payer: Self-pay | Admitting: Gastroenterology

## 2020-11-15 ENCOUNTER — Other Ambulatory Visit: Payer: Self-pay | Admitting: Gastroenterology

## 2020-11-15 MED ORDER — RABEPRAZOLE SODIUM 20 MG PO TBEC: 20.0000 mg | DELAYED_RELEASE_TABLET | Freq: Every day | ORAL | 0 refills | Status: DC

## 2020-11-15 NOTE — Telephone Encounter (Signed)
Dexilant was denied... Patient has to try and fail 4 of the following medications    a. Esomeprazole; b.Lansoprazole; c. Omeprazole; d. Pantoprazole; and/or e. Rabeprazole  Please advise

## 2020-12-23 ENCOUNTER — Encounter: Payer: Self-pay | Admitting: Gastroenterology

## 2020-12-23 ENCOUNTER — Ambulatory Visit (INDEPENDENT_AMBULATORY_CARE_PROVIDER_SITE_OTHER): Payer: Managed Care, Other (non HMO) | Admitting: Gastroenterology

## 2020-12-23 ENCOUNTER — Other Ambulatory Visit: Payer: Self-pay

## 2020-12-23 VITALS — BP 128/83 | HR 50 | Temp 98.6°F | Wt 260.0 lb

## 2020-12-23 DIAGNOSIS — K219 Gastro-esophageal reflux disease without esophagitis: Secondary | ICD-10-CM

## 2020-12-23 DIAGNOSIS — K200 Eosinophilic esophagitis: Secondary | ICD-10-CM

## 2020-12-23 MED ORDER — DEXLANSOPRAZOLE 30 MG PO CPDR: DELAYED_RELEASE_CAPSULE | ORAL | 0 refills | Status: DC

## 2020-12-24 NOTE — Progress Notes (Signed)
[name], MD [address]  [place]  [place], [place] [address]  Main: [phone]  Fax: [phone]   Primary Care Physician: [name], MD   Chief complaint: Reflux  HPI: Eddie Lopez is a 40 y.o. male here for follow-up of GERD and EOE.  Since last visit, patient reports that he has intentionally lost 15 pounds and is watching his diet and following acid reflux lifestyle measures.  With this, his symptoms of reflux are much better.  He used to wake up at night frequently with bad heartburn and referral for hiatal hernia repair was made due to this.  He has an appointment coming up in 2 weeks.  However, he states he is not waking up at night anymore, except occasional symptoms about once every other week and he takes Tums at that time if needed.  Because omeprazole was not being covered by his insurance, we had prescribed Dexilant which patient initially called [place] instead that is not being covered as well.  We had changed to Aciphex, but patient states he ended up picking up [place] because somehow the pharmacy did fill it for him.  He is taking Dexilant 60 mg in the morning with good control of symptoms at this time.  No dysphagia.  No nausea or vomiting.  No altered bowel habits.  Previous history: He has a 2 cm hiatal hernia noted on upper endoscopy.   See Dr. Donneta Romberg recent endoscopy report as well   Previous history: His most recent upper endoscopy with Dr. Rush Landmark which was done for evaluation of a duodenal lesion, with biopsies done during the procedure proved it to be benign normal major and minor papilla.  No precancerous tissue was present.  Esophageal biopsies do show improvement in the number of eosinophils in his proximal distal esophagus compared to January 2022.   On previous visits, we also noted that his May 2018 labs showed mildly elevated bilirubin chronically. Liver work-up, and abdominal ultrasound were ordered.  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  Blood work was negative for viral and autoimmune hepatitis.  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 His immunization records show that he was immunized to hepatitis a and B in the past.   The ultrasound also reported splenomegaly with spleen measuring 14.8 cm.  He was referred to hematology by his primary care provider.  And they have ordered work-up which has been unrevealing.  Dr. Grayland Ormond, wanted to obtain a CT scan of his abdomen, but was not approved by insurance.  An MRI was done instead and it is available under imaging, 10/12/2017, labeled "ultrasound outside films body" and reports a normal spleen.   His EGD on March 09, 2017, showed endoscopic features of eosinophilic esophagitis.  Pathology report did not show any eosinophils in the distal or proximal esophagus, and reported changes compatible with reflux.  Stomach biopsies showed mild chronic gastritis, negative for H. pylori.  A small island of salmon colored mucosa and biopsies showed reflux gastroesophagitis, negative for goblet cells, dysplasia and malignancy.     2011 pathology (see procedures tab) showed increased eosinophils at GE junction, distal and proximal esophagus   As per previous notes: Reportedly had an EGD 4-5 yrs ago for acid reflux.  He did not have any symptoms of food impaction at that time or now.  EGD reportedly showed eosinophilic esophagitis at the time.  He did not have any reports or pathology reports from this procedure.  He was started on PPI at that time and then transitioned over to a swallowed steroid.  He states the PPI helped with the swallowed steroid did not.  Over time he stopped taking both medications.  In June or July of this year his heartburn returned and his primary care provider prescribed Prilosec 40 mg daily in the morning.  This has helped his daytime symptoms but he is continuing to have heartburn at  night.   Since this patient has never actually had true dysphagia, or any food impactions, and he specifically states that his symptoms did not change with the fluticasone he was prescribed when he was diagnosed with it in [facility], changing his medications from PPI twice daily which is now finally controlling his reflux symptoms well, to fluticasone may not be the next best step at this point   He is in clinical remission as he is not having any more symptoms, as far as dysphagia   Not having any further reflux symptoms either with omeprazole 40 mg p.o. twice daily   Discussion of treatment plan on April 2022:   He is not in histologic remission based on most recent biopsies, but they do show improvement from January 2022 even though he was only on half the dose omeprazole he is on now since March 2022   We discussed several options in detail with the patient, including changing to swallowed steroid, continued PPI, allergy and allergy testing, 6 food elimination diet   After discussing several options, with our shared decision-making, we will refer patient to allergy immunology to assess if there are certain food groups that he can eliminate which could help with decreasing his eosinophils in his esophagus.    Again, changing therapy from PPI to fluticasone may worsen his previously uncontrolled reflux again, without any benefit in regard to his previously nonexistent symptoms from EOE itself   However, if symptoms change, swallowed steroid will need to definitely be considered  ROS: All ROS reviewed and negative except as per HPI   Past Medical History:  Diagnosis Date   GERD (gastroesophageal reflux disease)    Hypertension    Palpitations    PVC's (premature ventricular contractions)     Past Surgical History:  Procedure Laterality Date   BIOPSY  06/07/2020   Procedure: BIOPSY;  Surgeon: Irving Copas., MD;  Location: [facility];  Service:  Gastroenterology;;   COLONOSCOPY WITH PROPOFOL N/A 03/12/2020   Procedure: COLONOSCOPY WITH PROPOFOL;  Surgeon: Virgel Manifold, MD;  Location: [facility];  Service: Endoscopy;  Laterality: N/A;   ESOPHAGOGASTRODUODENOSCOPY (EGD) WITH PROPOFOL N/A 03/09/2017   Procedure: ESOPHAGOGASTRODUODENOSCOPY (EGD) WITH PROPOFOL;  Surgeon: Virgel Manifold, MD;  Location: [facility];  Service: Endoscopy;  Laterality: N/A;   ESOPHAGOGASTRODUODENOSCOPY (EGD) WITH PROPOFOL N/A 03/12/2020   Procedure: ESOPHAGOGASTRODUODENOSCOPY (EGD) WITH PROPOFOL;  Surgeon: Virgel Manifold, MD;  Location: [facility];  Service: Endoscopy;  Laterality: N/A;   ESOPHAGOGASTRODUODENOSCOPY (EGD) WITH PROPOFOL N/A 06/07/2020   Procedure: ESOPHAGOGASTRODUODENOSCOPY (EGD) WITH PROPOFOL;  Surgeon: Rush Landmark Telford Nab., MD;  Location: [facility];  Service: Gastroenterology;  Laterality: N/A;    Prior to Admission medications   Medication Sig Start Date End Date Taking? Authorizing Provider  Dexlansoprazole (DEXILANT) 30 MG capsule Take 2 capsules (60 mg total) by mouth daily for 30 days, THEN 1 capsule (30 mg total) daily. 12/23/20 03/23/21 Yes Mailin Coglianese, Margretta Sidle B, MD  nebivolol (BYSTOLIC) 2.5 MG tablet Take 1 tablet (2.5 mg total) by mouth daily. 10/28/20  Yes [name], MD    Family History  Problem Relation Age of Onset   Throat cancer Maternal Grandfather    Cancer Maternal Grandmother    Cancer Paternal Grandmother      Social History   Tobacco Use   Smoking status: Never   Smokeless tobacco: Never  Vaping Use   Vaping Use: Never used  Substance Use Topics   Alcohol use: Yes    Alcohol/week: 0.0 standard drinks    Comment: 1 beer a week at most weekly   Drug use: No    Allergies as of 12/23/2020   (No Known Allergies)    Physical Examination:  Constitutional: General:   Alert,  Well-developed, well-nourished, pleasant and cooperative in NAD BP 128/83   Pulse (!) 50   Temp  98.6 F (37 C) (Oral)   Wt 260 lb (117.9 kg)   BMI 33.38 kg/m   Respiratory: Normal respiratory effort  Gastrointestinal:  Soft, non-tender and non-distended without masses, hepatosplenomegaly or hernias noted.  No guarding or rebound tenderness.     Cardiac: No clubbing or edema.  No cyanosis. Normal posterior tibial pedal pulses noted.  Psych:  Alert and cooperative. Normal mood and affect.  Musculoskeletal:  Normal gait. Head normocephalic, atraumatic. Symmetrical without gross deformities. 5/5 Lower extremity strength bilaterally.  Skin: Warm. Intact without significant lesions or rashes. No jaundice.  Neck: Supple, trachea midline  Lymph: No cervical lymphadenopathy  Psych:  Alert and oriented x3, Alert and cooperative. Normal mood and affect.  Labs: CMP     Component Value Date/Time   NA 138 10/13/2020 0854   NA 142 04/23/2017 0815   K 4.2 10/13/2020 0854   CL 101 10/13/2020 0854   CO2 27 10/13/2020 0854   GLUCOSE 87 10/13/2020 0854   BUN 16 10/13/2020 0854   BUN 13 04/23/2017 0815   CREATININE 1.15 10/13/2020 0854   CREATININE 0.85 10/04/2018 1412   CALCIUM 9.6 10/13/2020 0854   PROT 7.1 10/13/2020 0854   PROT 7.1 04/23/2017 0815   ALBUMIN 4.3 10/13/2020 0854   ALBUMIN 4.4 04/23/2017 0815   AST 19 10/13/2020 0854   ALT 24 10/13/2020 0854   ALKPHOS 78 10/13/2020 0854   BILITOT 1.1 10/13/2020 0854   BILITOT 0.8 04/23/2017 0815   GFRNONAA 96 04/23/2017 0815   GFRAA 111 04/23/2017 0815   Lab Results  Component Value Date   WBC 4.2 05/16/2017   HGB 16.4 05/16/2017   HCT 46.5 05/16/2017   MCV 83 05/16/2017   PLT 205 05/16/2017    Imaging Studies:   Assessment and Plan:   Eddie Lopez is a 40 y.o. y/o male here for follow-up of EOE and GERD  Patient has never had dysphagia symptoms from his EOE.  Please see previous notes in HPI for further details of this  His main symptoms have been severe reflux, waking him up at night despite PPI therapy.   However, the symptoms are significantly improved at this time with intentional weight loss, acid reflux lifestyle measures and patient taking Dexilant daily  Patient states he is not keen on surgery of his hiatal hernia at this time given that symptoms have significantly improved since last visit with above measures.  Therefore, he may reschedule his appointment with [facility] for later time, and see them if symptoms start worsening again  Continue Dexilant 60 mg once daily for another month.  If symptoms remain well controlled, decrease Dexilant to 30 mg once daily.  Given the severity  of his symptoms previously, patient may need PPI therapy for a while.  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 Diff diarrhea, pneumonia, infections, CKD, electrolyte abnormalities.  Pt. Verbalizes understanding and chooses to continue the medication.)  Follow-up in 6 months or earlier if needed   Dr [name]

## 2020-12-27 ENCOUNTER — Other Ambulatory Visit: Payer: Self-pay | Admitting: Gastroenterology

## 2021-01-04 MED ORDER — DEXILANT 30 MG PO CPDR: DELAYED_RELEASE_CAPSULE | ORAL | 0 refills | Status: DC

## 2021-02-09 ENCOUNTER — Telehealth: Payer: Managed Care, Other (non HMO) | Admitting: Nurse Practitioner

## 2021-02-09 DIAGNOSIS — R6889 Other general symptoms and signs: Secondary | ICD-10-CM

## 2021-02-09 NOTE — Progress Notes (Signed)
E visit for Flu like symptoms   We are sorry that you are not feeling well.  Here is how we plan to help! Based on what you have shared with me it looks like you may have a respiratory virus that may be influenza.  Influenza or "the flu" is   an infection caused by a respiratory virus. The flu virus is highly contagious and persons who did not receive their yearly flu vaccination may "catch" the flu from close contact.  We have anti-viral medications to treat the viruses that cause this infection. They are not a "cure" and only shorten the course of the infection. These prescriptions are most effective when they are given within the first 2 days of "flu" symptoms. Antiviral medication are indicated if you have a high risk of complications from the flu. You should  also consider an antiviral medication if you are in close contact with someone who is at risk. These medications can help patients avoid complications from the flu  but have side effects that you should know. Possible side effects from Tamiflu or oseltamivir include nausea, vomiting, diarrhea, dizziness, headaches, eye redness, sleep problems or other respiratory symptoms. You should not take Tamiflu if you have an allergy to oseltamivir or any to the ingredients in Tamiflu.  Based upon your symptoms and potential risk factors I recommend that you follow the flu symptoms recommendation that I have listed below. You are out of treatment window for flu treatment. Treatments must be started within 48 hours in order to be effective.   ANYONE WHO HAS FLU SYMPTOMS SHOULD: Stay home. The flu is highly contagious and going out or to work exposes others! Be sure to drink plenty of fluids. Water is fine as well as fruit juices, sodas and electrolyte beverages. You may want to stay away from caffeine or alcohol. If you are nauseated, try taking small sips of liquids. How do you know if you are getting enough fluid? Your urine should be a pale yellow or  almost colorless. Get rest. Taking a steamy shower or using a humidifier may help nasal congestion and ease sore throat pain. Using a saline nasal spray works much the same way. Cough drops, hard candies and sore throat lozenges may ease your cough. Line up a caregiver. Have someone check on you regularly.   GET HELP RIGHT AWAY IF: You cannot keep down liquids or your medications. You become short of breath Your fell like you are going to pass out or loose consciousness. Your symptoms persist after you have completed your treatment plan MAKE SURE YOU  Understand these instructions. Will watch your condition. Will get help right away if you are not doing well or get worse.  Your e-visit answers were reviewed by a board certified advanced clinical practitioner to complete your personal care plan.  Depending on the condition, your plan could have included both over the counter or prescription medications.  If there is a problem please reply  once you have received a response from your provider.  Your safety is important to [place].  If you have drug allergies check your prescription carefully.    You can use MyChart to ask questions about today's visit, request a non-urgent call back, or ask for a work or school excuse for 24 hours related to this e-Visit. If it has been greater than 24 hours you will need to follow up with your provider, or enter a new e-Visit to address those concerns.  You will get  an e-mail in the next two days asking about your experience.  I hope that your e-visit has been valuable and will speed your recovery. Thank you for using e-visits.  5-10 minutes spent reviewing and documenting in chart.

## 2021-03-10 ENCOUNTER — Other Ambulatory Visit: Payer: Self-pay | Admitting: Gastroenterology

## 2021-03-11 MED ORDER — DEXILANT 30 MG PO CPDR: 30.0000 mg | DELAYED_RELEASE_CAPSULE | Freq: Every day | ORAL | 0 refills | Status: DC

## 2021-05-12 ENCOUNTER — Telehealth: Payer: Self-pay

## 2021-05-12 NOTE — Telephone Encounter (Signed)
Referral for esophageal manometry with pH/impedance. ? ?Provider Dr Jens Som of [facility] ? ?Called the patient. No answer. Left a message with information and a call back number if he has questions. ? ?Scheduled for 06/15/21. He will arrive at 12:00 pm. Instructions will be mailed tomorrow. ?

## 2021-05-16 NOTE — Telephone Encounter (Signed)
Left a message requesting a return call. Need to know if he will take the 06/15/21 appointment for the manometry. ?

## 2021-05-16 NOTE — Telephone Encounter (Signed)
Patient returned your call states he does want to proceed with 06/15/21. ?

## 2021-06-07 ENCOUNTER — Encounter (HOSPITAL_COMMUNITY): Payer: Self-pay | Admitting: Gastroenterology

## 2021-06-15 ENCOUNTER — Encounter (HOSPITAL_COMMUNITY)
Admission: RE | Disposition: A | Discharge status: Home / Self Care | Payer: Self-pay | Source: Home / Self Care | Attending: Gastroenterology

## 2021-06-15 ENCOUNTER — Ambulatory Visit (HOSPITAL_COMMUNITY)
Admission: RE | Admit: 2021-06-15 | Discharge: 2021-06-15 | Disposition: A | Discharge status: Home / Self Care | Payer: Managed Care, Other (non HMO) | Attending: Gastroenterology | Admitting: Gastroenterology

## 2021-06-15 DIAGNOSIS — R12 Heartburn: Secondary | ICD-10-CM | POA: Diagnosis not present

## 2021-06-15 DIAGNOSIS — K219 Gastro-esophageal reflux disease without esophagitis: Secondary | ICD-10-CM | POA: Insufficient documentation

## 2021-06-15 DIAGNOSIS — Z01818 Encounter for other preprocedural examination: Secondary | ICD-10-CM | POA: Insufficient documentation

## 2021-06-15 DIAGNOSIS — K449 Diaphragmatic hernia without obstruction or gangrene: Secondary | ICD-10-CM | POA: Diagnosis not present

## 2021-06-15 HISTORY — PX: PH IMPEDANCE STUDY: SHX5565

## 2021-06-15 HISTORY — PX: ESOPHAGEAL MANOMETRY: SHX5429

## 2021-06-15 SURGERY — MANOMETRY, ESOPHAGUS
Anesthesia: Choice

## 2021-06-15 MED ORDER — LIDOCAINE VISCOUS HCL 2 % MT SOLN
OROMUCOSAL | Status: AC
  Filled 2021-06-15: qty 15

## 2021-06-15 SURGICAL SUPPLY — 2 items
FACESHIELD LNG OPTICON STERILE (SAFETY) IMPLANT
GLOVE BIO SURGEON STRL SZ8 (GLOVE) ×4 IMPLANT

## 2021-06-15 NOTE — Progress Notes (Signed)
Esophageal Manometry done per protocol. Pt tolerated well with out complication. Ph with impedance done per protocol. Pt tolerated well. Instructions given regarding the study and monitor. Pt verbalized understand and return demonstrated use of monitor. Pt will return tomorrow to have probe removed and monitor downloaded.  

## 2021-06-16 ENCOUNTER — Encounter (HOSPITAL_COMMUNITY): Payer: Self-pay | Admitting: Gastroenterology

## 2021-07-01 DIAGNOSIS — R12 Heartburn: Secondary | ICD-10-CM

## 2021-10-17 ENCOUNTER — Encounter: Payer: Managed Care, Other (non HMO) | Admitting: Family Medicine

## 2021-10-19 ENCOUNTER — Telehealth: Payer: Self-pay | Admitting: Gastroenterology

## 2021-10-19 NOTE — Telephone Encounter (Signed)
Good Morning Dr Rush Landmark,  We have received a request from patient to reestablish GI care with you due to their previous provider leaving.  Patient was last seen with [facility].Patient last procedure was 06/2020 with you as the provider. Please review records available in epics and advise on scheduling.  Thank you

## 2021-10-22 LAB — HEPATIC FUNCTION PANEL
ALT: 39 U/L (ref 10–40)
AST: 26 (ref 14–40)
Alkaline Phosphatase: 82 (ref 25–125)

## 2021-10-22 LAB — CBC AND DIFFERENTIAL
HCT: 49 (ref 41–53)
Hemoglobin: 16.4 (ref 13.5–17.5)
Platelets: 21 10*3/uL — AB (ref 150–400)
WBC: 5.3

## 2021-10-22 LAB — BASIC METABOLIC PANEL
BUN: 13 (ref 4–21)
Chloride: 100 (ref 99–108)
Glucose: 76
Potassium: 4.2 mEq/L (ref 3.5–5.1)
Sodium: 140 (ref 137–147)

## 2021-10-22 LAB — HEMOGLOBIN A1C: Hemoglobin A1C: 5.3

## 2021-10-22 LAB — PSA: PSA: 2.8

## 2021-10-22 LAB — LIPID PANEL
Cholesterol: 195 (ref 0–200)
HDL: 40 (ref 35–70)
LDL Cholesterol: 29
LDl/HDL Ratio: 4.9
Triglycerides: 161 — AB (ref 40–160)

## 2021-10-22 LAB — COMPREHENSIVE METABOLIC PANEL
Albumin: 4.9 (ref 3.5–5.0)
Calcium: 9.9 (ref 8.7–10.7)
Globulin: 2.5
eGFR: 101

## 2021-10-22 LAB — TSH: TSH: 2.24 (ref 0.41–5.90)

## 2021-10-22 LAB — CBC: RBC: 5.65 — AB (ref 3.87–5.11)

## 2021-10-22 LAB — VITAMIN D 25 HYDROXY (VIT D DEFICIENCY, FRACTURES): Vit D, 25-Hydroxy: 22.7

## 2021-10-25 NOTE — Telephone Encounter (Signed)
Due to patient's previous GI provider leaving [facility], I am willing to accept this patient for transition of care. He can be set up for a clinic visit with myself or one of the APP's (I can staff) pending availability. Thanks. GM

## 2021-10-28 ENCOUNTER — Encounter: Payer: Self-pay | Admitting: Family Medicine

## 2021-10-28 DIAGNOSIS — K219 Gastro-esophageal reflux disease without esophagitis: Secondary | ICD-10-CM

## 2021-10-28 DIAGNOSIS — K200 Eosinophilic esophagitis: Secondary | ICD-10-CM

## 2021-11-09 ENCOUNTER — Encounter: Payer: Self-pay | Admitting: Gastroenterology

## 2021-11-09 NOTE — Telephone Encounter (Signed)
Called patient and left voicemail to schedule.

## 2021-11-19 ENCOUNTER — Other Ambulatory Visit: Payer: Self-pay | Admitting: Family Medicine

## 2021-11-21 ENCOUNTER — Encounter: Payer: Self-pay | Admitting: Family Medicine

## 2021-11-21 DIAGNOSIS — R002 Palpitations: Secondary | ICD-10-CM

## 2021-11-22 MED ORDER — NEBIVOLOL HCL 2.5 MG PO TABS: 2.5000 mg | ORAL_TABLET | Freq: Every day | ORAL | 1 refills | Status: DC

## 2021-12-19 ENCOUNTER — Encounter: Payer: Managed Care, Other (non HMO) | Admitting: Family Medicine

## 2022-01-10 ENCOUNTER — Ambulatory Visit: Payer: Managed Care, Other (non HMO) | Admitting: Gastroenterology

## 2022-01-10 ENCOUNTER — Encounter: Payer: Self-pay | Admitting: Gastroenterology

## 2022-01-10 VITALS — BP 116/80 | HR 60 | Ht 72.5 in | Wt 262.0 lb

## 2022-01-10 DIAGNOSIS — Z1211 Encounter for screening for malignant neoplasm of colon: Secondary | ICD-10-CM

## 2022-01-10 DIAGNOSIS — K449 Diaphragmatic hernia without obstruction or gangrene: Secondary | ICD-10-CM | POA: Diagnosis not present

## 2022-01-10 DIAGNOSIS — Z860101 Personal history of adenomatous and serrated colon polyps: Secondary | ICD-10-CM

## 2022-01-10 DIAGNOSIS — K219 Gastro-esophageal reflux disease without esophagitis: Secondary | ICD-10-CM | POA: Diagnosis not present

## 2022-01-10 DIAGNOSIS — K200 Eosinophilic esophagitis: Secondary | ICD-10-CM

## 2022-01-10 DIAGNOSIS — Z86010 Personal history of colonic polyps: Secondary | ICD-10-CM

## 2022-01-10 MED ORDER — OMEPRAZOLE 40 MG PO CPDR: 40.0000 mg | DELAYED_RELEASE_CAPSULE | Freq: Every day | ORAL | 6 refills | Status: DC

## 2022-01-10 NOTE — Patient Instructions (Signed)
You have been scheduled for an endoscopy. Please follow written instructions given to you at your visit today. If you use inhalers (even only as needed), please bring them with you on the day of your procedure.  We have sent the following medications to your pharmacy for you to pick up at your convenience: Omeprazole   _____________________________________________   Dennis Bast will be due for a recall colonoscopy in 2026. We will send you a reminder in the mail when it gets closer to that tim  If you are age 42 or older, your body mass index should be between 23-30. Your Body mass index is 35.05 kg/m. If this is out of the aforementioned range listed, please consider follow up with your Primary Care Provider.  If you are age 55 or younger, your body mass index should be between 19-25. Your Body mass index is 35.05 kg/m. If this is out of the aformentioned range listed, please consider follow up with your Primary Care Provider.   ________________________________________________________  The [facility] providers would like to encourage you to use [facility] to communicate with providers for non-urgent requests or questions.  Due to long hold times on the telephone, sending your provider a message by [facility] may be a faster and more efficient way to get a response.  Please allow 48 business hours for a response.  Please remember that this is for non-urgent requests.  _______________________________________________________  Due to recent changes in healthcare laws, you may see the results of your imaging and laboratory studies on MyChart before your provider has had a chance to review them.  We understand that in some cases there may be results that are confusing or concerning to you. Not all laboratory results come back in the same time frame and the provider may be waiting for multiple results in order to interpret others.  Please give [place] 48 hours in order for your provider to thoroughly review all the  results before contacting the office for clarification of your results.   Thank you for choosing me and [facility].  Dr. Rush Landmark

## 2022-01-13 ENCOUNTER — Encounter: Payer: Self-pay | Admitting: Gastroenterology

## 2022-01-13 DIAGNOSIS — Z86010 Personal history of colonic polyps: Secondary | ICD-10-CM | POA: Insufficient documentation

## 2022-01-13 DIAGNOSIS — K449 Diaphragmatic hernia without obstruction or gangrene: Secondary | ICD-10-CM | POA: Insufficient documentation

## 2022-01-13 NOTE — Progress Notes (Signed)
[place] VISIT   Primary Care Provider [name], MD [address] [place] [address] [phone]  Patient Profile: Eddie Lopez is a 41 y.o. male with a pmh significant for hypertension, GERD, hiatal hernia, eosinophilic esophagitis, colon polyps (SSP's).  The patient presents to the [facility] for an evaluation and management of problem(s) noted below:  Problem List 1. Eosinophilic esophagitis   2. Gastroesophageal reflux disease without esophagitis but evidence on pH impedance   3. Hx of adenomatous colonic polyps   4. Hiatal hernia     History of Present Illness This is a patient that I met back in 2022 after a referral from [place] with concern for a duodenal lesion.  I performed an endoscopic ultrasound as outlined below that did not show any evidence of any precancerous lesions but rather a normal major and minor papilla.  I retook biopsies of his esophagus due to history of previous eosinophilic esophagitis which showed persisting elevation in his eosinophil count.  He subsequently followed up with his primary gastroenterologist who is now left [facility].  Patient states that he has had acid reflux for years.  Most of his issues occur at night when he is awoken by having significant acid reflux.  In the past he had been on Dexilant, but after his GI provider left, he ended up going on OTC omeprazole.  He also uses OTC Pepcid 20 mg at night has not clearly made a difference for him.  This helps somewhat but he still breaks through at night.  He does not have a significant waterbrash in the morning.  He has been taking he had undergone pH impedance testing and manometry that is outlined below but did confirm overt evidence of persisting acid reflux.  He saw Dr. Amado Coe from [facility] to consider fundoplication/hiatal hernia repair but held off on this.  Today the patient denies any overt dysphagia symptoms.   He has been trying to actively lose weight.  He has not had any alteration of his bowel habits and has a daily bowel movement without any blood.  GI Review of Systems Positive as above Negative for odynophagia, nausea, vomiting, change in bowel habits, melena, hematochezia  Review of Systems General: Denies fevers/chills/weight loss unintentionally HEENT: Denies sore throat Cardiovascular: Denies chest pain Pulmonary: Denies shortness of breath Gastroenterological: See HPI Genitourinary: Denies darkened urine Hematological: Denies easy bruising/bleeding Dermatological: Denies jaundice Psychological: Mood is stable   Medications Current Outpatient Medications  Medication Sig Dispense Refill   nebivolol (BYSTOLIC) 2.5 MG tablet Take 1 tablet (2.5 mg total) by mouth daily. 90 tablet 1   omeprazole (PRILOSEC) 40 MG capsule Take 1 capsule (40 mg total) by mouth daily. 30 capsule 6   Vitamin D, Ergocalciferol, (DRISDOL) 1.25 MG (50000 UNIT) CAPS capsule Take 50,000 Units by mouth once a week.     No current facility-administered medications for this visit.    Allergies No Known Allergies  Histories Past Medical History:  Diagnosis Date   Eosinophilic esophagitis    GERD (gastroesophageal reflux disease)    Hypertension    Palpitations    PVC's (premature ventricular contractions)    Vitamin D deficiency    Past Surgical History:  Procedure Laterality Date   BIOPSY  06/07/2020   Procedure: BIOPSY;  Surgeon: Irving Copas., MD;  Location: [facility];  Service: Gastroenterology;;   COLONOSCOPY WITH PROPOFOL N/A 03/12/2020   Procedure: COLONOSCOPY WITH PROPOFOL;  Surgeon: Virgel Manifold,  MD;  Location: [facility];  Service: Endoscopy;  Laterality: N/A;   ESOPHAGEAL MANOMETRY N/A 06/15/2021   Procedure: ESOPHAGEAL MANOMETRY (EM);  Surgeon: Mauri Pole, MD;  Location: [facility];  Service: Gastroenterology;  Laterality: N/A;   ESOPHAGOGASTRODUODENOSCOPY  (EGD) WITH PROPOFOL N/A 03/09/2017   Procedure: ESOPHAGOGASTRODUODENOSCOPY (EGD) WITH PROPOFOL;  Surgeon: Virgel Manifold, MD;  Location: [facility];  Service: Endoscopy;  Laterality: N/A;   ESOPHAGOGASTRODUODENOSCOPY (EGD) WITH PROPOFOL N/A 03/12/2020   Procedure: ESOPHAGOGASTRODUODENOSCOPY (EGD) WITH PROPOFOL;  Surgeon: Virgel Manifold, MD;  Location: [facility];  Service: Endoscopy;  Laterality: N/A;   ESOPHAGOGASTRODUODENOSCOPY (EGD) WITH PROPOFOL N/A 06/07/2020   Procedure: ESOPHAGOGASTRODUODENOSCOPY (EGD) WITH PROPOFOL;  Surgeon: Rush Landmark Telford Nab., MD;  Location: [facility];  Service: Gastroenterology;  Laterality: N/A;   [place] IMPEDANCE STUDY N/A 06/15/2021   Procedure: [place] IMPEDANCE STUDY;  Surgeon: Mauri Pole, MD;  Location: [facility];  Service: Gastroenterology;  Laterality: N/A;   Social History   Socioeconomic History   Marital status: Married    Spouse name: Not on file   Number of children: 2   Years of education: Not on file   Highest education level: Not on file  Occupational History   Occupation: EMS director/paramedic  Tobacco Use   Smoking status: Never   Smokeless tobacco: Never  Vaping Use   Vaping Use: Never used  Substance and Sexual Activity   Alcohol use: Yes    Alcohol/week: 0.0 standard drinks of alcohol    Comment: 1 beer a week at most weekly   Drug use: No   Sexual activity: Yes  Other Topics Concern   Not on file  Social History Narrative   Not on file   Social Determinants of Health   Financial Resource Strain: Not on file  Food Insecurity: Not on file  Transportation Needs: Not on file  Physical Activity: Not on file  Stress: Not on file  Social Connections: Not on file  Intimate Partner Violence: Not on file   Family History  Problem Relation Age of Onset   Hyperlipidemia Mother    Prostate cancer Father    Heart disease Maternal Grandmother    Throat cancer Maternal Grandfather    Leukemia Paternal  Grandmother    Heart disease Paternal Grandfather    Colon cancer Neg Hx    Esophageal cancer Neg Hx    Inflammatory bowel disease Neg Hx    Liver disease Neg Hx    Pancreatic cancer Neg Hx    Rectal cancer Neg Hx    Stomach cancer Neg Hx    I have reviewed his medical, social, and family history in detail and updated the electronic medical record as necessary.    PHYSICAL EXAMINATION  BP 116/80 (BP Location: Left Arm, Patient Position: Sitting, Cuff Size: Large)   Pulse 60   Ht 6' 0.5" (1.842 m) Comment: height measured without shoes  Wt 262 lb (118.8 kg)   BMI 35.05 kg/m  Wt Readings from Last 3 Encounters:  01/10/22 262 lb (118.8 kg)  12/23/20 260 lb (117.9 kg)  10/13/20 267 lb 12.8 oz (121.5 kg)  GEN: NAD, appears stated age, doesn't appear chronically ill PSYCH: Cooperative, without pressured speech EYE: Conjunctivae pink, sclerae anicteric ENT: MMM CV: Nontachycardic RESP: No audible wheezing GI: NABS, soft, NT/ND, without rebound or guarding MSK/EXT: No lower extremity edema SKIN: No jaundice NEURO:  Alert & Oriented x 3, no focal deficits   REVIEW OF DATA  I reviewed the following data  at the time of this encounter:  GI Procedures and Studies  April 2022 EGD - Esophageal mucosal changes suggestive of eosinophilic esophagitis. Biopsied. - Z-line irregular, 40 cm from the incisors. - 2 cm hiatal hernia. - No gross lesions in the stomach. - Mucosal prominence of the minor papilla was found - biopsied. - Mucosal prominence of the major papilla with tail extension was found - biopsied. - No other gross lesions in the duodenal bulb, in the first portion of the duodenum and in the second portion of the duodenum.  Pathology FINAL MICROSCOPIC DIAGNOSIS:  A. MAJOR AMPULLA, BIOPSY:  - Polypoid ampullary mucosa with mild inflammation and reactive changes.  - No atypia, adenomatous change or carcinoma.  B. MINOR PAPILLA, BIOPSY:  - Duodenal mucosa with mild  inflammation and reactive changes.  No atypia, adenomatous change or carcinoma.  C. ESOPHAGUS, DISTAL, BIOPSY:  - Squamous mucosa with reflux changes and mildly increased eosinophils.  - Focally up to 15 per high-power field.  D. ESOPHAGUS, PROXIMAL/MIDDLE, BIOPSY:  - Squamous mucosa with mildly increased eosinophils.  - Focally up to 13 per high-power field.   January 2022 colonoscopy (outside provider) - Two 5 to 6 mm polyps in the ascending colon and in the cecum, removed with a cold snare. Resected and retrieved. - The examination was otherwise normal. - The rectum, sigmoid colon, descending colon, transverse colon, ascending colon, cecum and terminal ileum are normal. Biopsied. - Non-bleeding internal hemorrhoids.  January 2022 EGD (outside provider) - Salmon-colored mucosa suspicious for short-segment Barrett's esophagus. Biopsied. - Normal stomach. - A single duodenal polyp. Biopsied. - Biopsies were obtained in the gastric body, at the incisura and in the gastric antrum.  Pathology DIAGNOSIS: A.  DUODENUM; COLD BIOPSY: - DUODENAL MUCOSA WITH INTACT VILLI. - NEGATIVE FOR ACTIVE INFLAMMATION, INCREASED EOSINOPHILS, INTRAEPITHELIAL LYMPHOCYTOSIS, AND INFECTIOUS AGENTS. B.  DUODENAL POLYP; COLD BIOPSY: - SUPERFICIAL DUODENAL MUCOSA WITH NONSPECIFIC VILLOUS BLUNTING INVOLVING 1 OF 2 FRAGMENTS. - NO ACTIVE INFLAMMATION OR INTRAEPITHELIAL LYMPHOCYTOSIS. - NO DYSPLASIA OR NEOPLASTIC PROCESS IS IDENTIFIED. - THE SAMPLE DOES NOT INCLUDE DEEP MUCOSA OR SUBMUCOSA, AND CORRELATION WITH THE ENDOSCOPIC APPEARANCE IS ADVISED. C.  STOMACH; COLD BIOPSY: - ANTRAL AND OXYNTIC MUCOSA WITH MILD CHRONIC INFLAMMATORY INFILTRATES AND MINIMAL REACTIVE FOVEOLAR HYPERPLASIA. - NEGATIVE FOR H. PYLORI, INCREASED EOSINOPHILS, INTESTINAL METAPLASIA, DYSPLASIA, AND MALIGNANCY. - ONE 1 MM FRAGMENT OF SMALL INTESTINAL-TYPE MUCOSA, PROBABLY DISPLACED FROM THE DUODENUM. D.  ESOPHAGUS, DISTAL; COLD  BIOPSY: - STRATIFIED SQUAMOUS EPITHELIUM WITH INCREASED EOSINOPHILS, WITH MAXIMUM OF 30 EOSINOPHILS PER HIGH-POWER FIELD, SEE COMMENT BELOW THE LIST OF DIAGNOSES. - NO INTRAEPITHELIAL NEUTROPHILS IDENTIFIED. - NEGATIVE FOR DYSPLASIA AND MALIGNANCY. - ONE 1.5 MM FRAGMENT OF GASTRIC OXYNTIC-TYPE MUCOSA, POSSIBLY DISPLACED FROM THE STOMACH. E.  ESOPHAGUS, PROXIMAL; COLD BIOPSY: - STRATIFIED SQUAMOUS EPITHELIUM WITH INCREASED EOSINOPHILS, WITH MAXIMUM OF 40 EOSINOPHILS PER HIGH-POWER FIELD. - NO INTRAEPITHELIAL NEUTROPHILS IDENTIFIED. - NEGATIVE FOR DYSPLASIA AND MALIGNANCY. F.  ESOPHAGUS, SALMON-COLORED MUCOSA; COLD BIOPSY: - SQUAMOCOLUMNAR MUCOSA WITH MILD CHRONIC INFLAMMATION. - SQUAMOUS INTRAEPITHELIAL EOSINOPHILS PRESENT, WITH MAXIMUM OF 15 EOSINOPHILS PER HIGH-POWER FIELD. - NEGATIVE FOR GOBLET CELLS, DYSPLASIA, AND MALIGNANCY. G.  COLON POLYP X 2, CECUM AND ASCENDING; COLD SNARE: - SESSILE SERRATED POLYPS, MULTIPLE FRAGMENTS. - NEGATIVE FOR DYSPLASIA AND MALIGNANCY. H.  TERMINAL ILEUM; COLD BIOPSY: - ILEAL MUCOSA WITH INTACT VILLI. - NEGATIVE FOR ACTIVE INFLAMMATION, EOSINOPHILIC INFILTRATE, INFECTIOUS AGENTS, AND GRANULOMAS. I.  COLON; RANDOM COLD BIOPSY: - COLONIC MUCOSA WITH INTACT CRYPT ARCHITECTURE. - NEGATIVE FOR MICROSCOPIC COLITIS, EOSINOPHILIC  INFILTRATE, DYSPLASIA, AND MALIGNANCY. Comment: Eosinophilic infiltrates appear to be limited to the esophageal squamous mucosa, with an increase in number proximally. The features would support a diagnosis of eosinophilic esophagitis in the appropriate clinical setting. The biopsies of the duodenal lesion do not contain diagnostic features, and if there is concern for an unsampled neoplasm then further workup is needed. There are no features to suggest microscopic colitis   April 2023 pH impedance  April 2023 manometry   Laboratory Studies  Reviewed those in epic  Imaging Studies  No relevant studies to  review   ASSESSMENT  Mr. Tesler is a 41 y.o. male with a pmh significant for hypertension, GERD, hiatal hernia, eosinophilic esophagitis, colon polyps (SSP's).  The patient is seen today for evaluation and management of:  1. Eosinophilic esophagitis   2. Gastroesophageal reflux disease without esophagitis but evidence on pH impedance   3. Hx of adenomatous colonic polyps   4. Hiatal hernia    The patient is hemodynamically stable.  Clinically, he continues to experience symptoms of acid reflux most especially at nighttime.  He has been on PPI therapy with relatively decent control.  However he continues to have breakthrough symptoms, especially at night.  His pH impedance testing is consistent with acid reflux issues and he had contemplated undergoing hiatal hernia repair and fundoplication with [facility] earlier this year but held off on that.  I think the next step for [place] is to truly evaluate and see what is going on with his eosinophils.  He has never been on budesonide therapy though does recall potentially being on fluticasone therapy.  I think it would be interesting to ensure that we see whether completely treated esophageal eosinophilia/eosinophilic esophagitis made any difference to his symptoms.  If they did then question is whether we need to think of maximizing his EOE therapy and also considering medication versus endoscopic versus surgical fundoplication.  I think he could be a good candidate for TIF as long as he has not had a progressive hiatal hernia develop.  We are going to keep him on his PPI.  We will plan to repeat his EGD with biopsies.  We will go from there.  The risks and benefits of endoscopic evaluation were discussed with the patient; these include but are not limited to the risk of perforation, infection, bleeding, missed lesions, lack of diagnosis, severe illness requiring hospitalization, as well as anesthesia and sedation related illnesses.  The patient  and/or family is agreeable to proceed.  His next colonoscopy will be in 2027 unless any other issues develop.  All patient questions were answered to the best of my ability, and the patient agrees to the aforementioned plan of action with follow-up as indicated.   PLAN  Increase to omeprazole 40 mg daily Proceed with scheduling EGD with biopsies for EOE history Consider budesonide therapy in future Consider role of TIF versus surgical fundoplication in future Colonoscopy in 2027   Orders Placed This Encounter  Procedures   Ambulatory referral to Gastroenterology    New Prescriptions   OMEPRAZOLE (PRILOSEC) 40 MG CAPSULE    Take 1 capsule (40 mg total) by mouth daily.   Modified Medications   No medications on file    Planned Follow Up No follow-ups on file.   Total Time in Face-to-Face and in Coordination of Care for patient including independent/personal interpretation/review of prior testing, medical history, examination, medication adjustment, communicating results with the patient directly, and documentation within the EHR is 30  minutes.   [name], MD [facility] Office # [phone]

## 2022-03-14 ENCOUNTER — Ambulatory Visit: Payer: Managed Care, Other (non HMO) | Admitting: Gastroenterology

## 2022-03-14 ENCOUNTER — Encounter: Payer: Self-pay | Admitting: Gastroenterology

## 2022-03-14 VITALS — BP 122/78 | HR 56 | Temp 98.7°F | Resp 17 | Ht 72.0 in | Wt 262.0 lb

## 2022-03-14 DIAGNOSIS — K2970 Gastritis, unspecified, without bleeding: Secondary | ICD-10-CM

## 2022-03-14 DIAGNOSIS — K200 Eosinophilic esophagitis: Secondary | ICD-10-CM | POA: Diagnosis present

## 2022-03-14 DIAGNOSIS — K449 Diaphragmatic hernia without obstruction or gangrene: Secondary | ICD-10-CM

## 2022-03-14 DIAGNOSIS — K219 Gastro-esophageal reflux disease without esophagitis: Secondary | ICD-10-CM

## 2022-03-14 DIAGNOSIS — K2950 Unspecified chronic gastritis without bleeding: Secondary | ICD-10-CM

## 2022-03-14 DIAGNOSIS — K222 Esophageal obstruction: Secondary | ICD-10-CM | POA: Diagnosis not present

## 2022-03-14 MED ORDER — SODIUM CHLORIDE 0.9 % IV SOLN: 500.0000 mL | INTRAVENOUS | Status: DC

## 2022-03-14 NOTE — Op Note (Signed)
[place] Patient Name: Eddie Lopez Procedure Date: 03/14/2022 3:05 PM MRN: [id number] Endoscopist: [name] , MD, [id number] Age: 42 Referring MD:  Date of Birth: [birth year] Gender: Male Account #: [id number] Procedure:                Upper GI endoscopy Indications:              Esophageal reflux, Failure to respond to medical                            treatment, Eosinophilic esophagitis, Follow-up of                            eosinophilic esophagitis Medicines:                Monitored Anesthesia Care Procedure:                Pre-Anesthesia Assessment:                           - Prior to the procedure, a History and Physical                            was performed, and patient medications and                            allergies were reviewed. The patient's tolerance of                            previous anesthesia was also reviewed. The risks                            and benefits of the procedure and the sedation                            options and risks were discussed with the patient.                            All questions were answered, and informed consent                            was obtained. Prior Anticoagulants: The patient has                            taken no anticoagulant or antiplatelet agents. ASA                            Grade Assessment: II - A patient with mild systemic                            disease. After reviewing the risks and benefits,                            the patient was deemed in satisfactory condition to  undergo the procedure.                           After obtaining informed consent, the endoscope was                            passed under direct vision. Throughout the                            procedure, the patient's blood pressure, pulse, and                            oxygen saturations were monitored continuously. The                            Endoscope was  introduced through the mouth, and                            advanced to the second part of duodenum. The upper                            GI endoscopy was accomplished without difficulty.                            The patient tolerated the procedure. Scope In: Scope Out: Findings:                 Mucosal changes including feline appearance were                            found in the entire esophagus. Esophageal findings                            were graded using the Eosinophilic Esophagitis                            Endoscopic Reference Score (EoE-EREFS) as: Edema                            Grade 0 Normal (distinct vascular markings), Rings                            Grade 1 Mild (subtle circumferential ridges seen on                            esophageal distension), Exudates Grade 0 None (no                            white lesions seen), Furrows Grade 0 None (no                            vertical lines seen) and Stricture none (no                            stricture found).  Biopsies were obtained from the                            proximal and middle esophagus with cold forceps for                            histology of suspected eosinophilic esophagitis.                            Biopsies were obtained from the distal esophagus                            with cold forceps for histology of suspected                            eosinophilic esophagitis.                           A widely patent Schatzki ring was found at the                            gastroesophageal junction.                           The Z-line was irregular and was found 40 cm from                            the incisors.                           A 3 cm hiatal hernia was present.                           The gastroesophageal flap valve was visualized                            endoscopically and classified as Hill Grade IV (no                            fold, wide open lumen, hiatal hernia present).                            Patchy mildly erythematous mucosa without bleeding                            was found in the entire examined stomach. Biopsies                            were taken with a cold forceps for histology and                            Helicobacter pylori testing.                           No gross lesions were noted in the duodenal  bulb,                            in the first portion of the duodenum and in the                            second portion of the duodenum. Complications:            No immediate complications. Estimated Blood Loss:     Estimated blood loss was minimal. Impression:               - Esophageal mucosal changes suggestive of                            eosinophilic esophagitis with mild rings noted. EoE                            biopsies obtained.                           - Widely patent Schatzki ring at [place] Jxn.                           - Z-line irregular, 40 cm from the incisors.                           - 3 cm hiatal hernia.                           - Gastroesophageal flap valve classified as Hill                            Grade IV (no fold, wide open lumen, hiatal hernia                            present).                           - Erythematous mucosa in the stomach. Biopsied.                           - No gross lesions in the duodenal bulb, in the                            first portion of the duodenum and in the second                            portion of the duodenum. Recommendation:           - The patient will be observed post-procedure,                            until all discharge criteria are met.                           - Discharge patient to home.                           -  Patient has a contact number available for                            emergencies. The signs and symptoms of potential                            delayed complications were discussed with the                            patient. Return to normal  activities tomorrow.                            Written discharge instructions were provided to the                            patient.                           - Resume previous diet.                           - Continue present medications.                           - Await pathology results. Consideration of steroid                            therapy pending results                           - Repeat upper endoscopy for surveillance based on                            pathology results.                           - The findings and recommendations were discussed                            with the patient.                           - The findings and recommendations were discussed                            with the patient's family. [name], MD 03/14/2022 3:24:26 PM

## 2022-03-14 NOTE — Progress Notes (Unsigned)
GASTROENTEROLOGY PROCEDURE H&P NOTE   Primary Care Physician: [name], MD  HPI: Eddie Lopez is a 42 y.o. male who presents for EGD for follow up of EoE and GERD.  Past Medical History:  Diagnosis Date   Eosinophilic esophagitis    GERD (gastroesophageal reflux disease)    Hypertension    Palpitations    PVC's (premature ventricular contractions)    Vitamin D deficiency    Past Surgical History:  Procedure Laterality Date   BIOPSY  06/07/2020   Procedure: BIOPSY;  Surgeon: Irving Copas., MD;  Location: [facility];  Service: Gastroenterology;;   COLONOSCOPY WITH PROPOFOL N/A 03/12/2020   Procedure: COLONOSCOPY WITH PROPOFOL;  Surgeon: Virgel Manifold, MD;  Location: [facility];  Service: Endoscopy;  Laterality: N/A;   ESOPHAGEAL MANOMETRY N/A 06/15/2021   Procedure: ESOPHAGEAL MANOMETRY (EM);  Surgeon: Mauri Pole, MD;  Location: [facility];  Service: Gastroenterology;  Laterality: N/A;   ESOPHAGOGASTRODUODENOSCOPY (EGD) WITH PROPOFOL N/A 03/09/2017   Procedure: ESOPHAGOGASTRODUODENOSCOPY (EGD) WITH PROPOFOL;  Surgeon: Virgel Manifold, MD;  Location: [facility];  Service: Endoscopy;  Laterality: N/A;   ESOPHAGOGASTRODUODENOSCOPY (EGD) WITH PROPOFOL N/A 03/12/2020   Procedure: ESOPHAGOGASTRODUODENOSCOPY (EGD) WITH PROPOFOL;  Surgeon: Virgel Manifold, MD;  Location: [facility];  Service: Endoscopy;  Laterality: N/A;   ESOPHAGOGASTRODUODENOSCOPY (EGD) WITH PROPOFOL N/A 06/07/2020   Procedure: ESOPHAGOGASTRODUODENOSCOPY (EGD) WITH PROPOFOL;  Surgeon: Rush Landmark Telford Nab., MD;  Location: [facility];  Service: Gastroenterology;  Laterality: N/A;   [place] IMPEDANCE STUDY N/A 06/15/2021   Procedure: [place] IMPEDANCE STUDY;  Surgeon: Mauri Pole, MD;  Location: [facility];  Service: Gastroenterology;  Laterality: N/A;   Current Outpatient Medications  Medication Sig Dispense Refill   nebivolol (BYSTOLIC) 2.5 MG tablet Take 1  tablet (2.5 mg total) by mouth daily. 90 tablet 1   omeprazole (PRILOSEC) 40 MG capsule Take 1 capsule (40 mg total) by mouth daily. 30 capsule 6   Vitamin D, Ergocalciferol, (DRISDOL) 1.25 MG (50000 UNIT) CAPS capsule Take 50,000 Units by mouth once a week.     No current facility-administered medications for this visit.    Current Outpatient Medications:    nebivolol (BYSTOLIC) 2.5 MG tablet, Take 1 tablet (2.5 mg total) by mouth daily., Disp: 90 tablet, Rfl: 1   omeprazole (PRILOSEC) 40 MG capsule, Take 1 capsule (40 mg total) by mouth daily., Disp: 30 capsule, Rfl: 6   Vitamin D, Ergocalciferol, (DRISDOL) 1.25 MG (50000 UNIT) CAPS capsule, Take 50,000 Units by mouth once a week., Disp: , Rfl:  No Known Allergies Family History  Problem Relation Age of Onset   Hyperlipidemia Mother    Prostate cancer Father    Heart disease Maternal Grandmother    Throat cancer Maternal Grandfather    Leukemia Paternal Grandmother    Heart disease Paternal Grandfather    Colon cancer Neg Hx    Esophageal cancer Neg Hx    Inflammatory bowel disease Neg Hx    Liver disease Neg Hx    Pancreatic cancer Neg Hx    Rectal cancer Neg Hx    Stomach cancer Neg Hx    Social History   Socioeconomic History   Marital status: Married    Spouse name: Not on file   Number of children: 2   Years of education: Not on file   Highest education level: Not on file  Occupational History   Occupation: EMS director/paramedic  Tobacco Use   Smoking status: Never   Smokeless tobacco: Never  Vaping Use   Vaping Use: Never used  Substance and Sexual Activity   Alcohol use: Yes    Alcohol/week: 0.0 standard drinks of alcohol    Comment: 1 beer a week at most weekly   Drug use: No   Sexual activity: Yes  Other Topics Concern   Not on file  Social History Narrative   Not on file   Social Determinants of Health   Financial Resource Strain: Not on file  Food Insecurity: Not on file  Transportation Needs:  Not on file  Physical Activity: Not on file  Stress: Not on file  Social Connections: Not on file  Intimate Partner Violence: Not on file    Physical Exam: There were no vitals filed for this visit. There is no height or weight on file to calculate BMI. GEN: NAD EYE: Sclerae anicteric ENT: MMM CV: Non-tachycardic GI: Soft, NT/ND NEURO:  Alert & Oriented x 3  Lab Results: No results for input(s): "WBC", "HGB", "HCT", "PLT" in the last 72 hours. BMET No results for input(s): "NA", "K", "CL", "CO2", "GLUCOSE", "BUN", "CREATININE", "CALCIUM" in the last 72 hours. LFT No results for input(s): "PROT", "ALBUMIN", "AST", "ALT", "ALKPHOS", "BILITOT", "BILIDIR", "IBILI" in the last 72 hours. PT/INR No results for input(s): "LABPROT", "INR" in the last 72 hours.   Impression / Plan: This is a 42 y.o.male who presents for EGD for follow up of EoE and GERD.  The risks and benefits of endoscopic evaluation/treatment were discussed with the patient and/or family; these include but are not limited to the risk of perforation, infection, bleeding, missed lesions, lack of diagnosis, severe illness requiring hospitalization, as well as anesthesia and sedation related illnesses.  The patient's history has been reviewed, patient examined, no change in status, and deemed stable for procedure.  The patient and/or family is agreeable to proceed.    [name], MD [facility] Office # [phone]

## 2022-03-14 NOTE — Progress Notes (Unsigned)
Report to PACU, RN, vss, BBS= Clear.  

## 2022-03-14 NOTE — Patient Instructions (Signed)
Read all of the handouts given to you by your recovery room nurse.  YOU HAD AN ENDOSCOPIC PROCEDURE TODAY AT [facility]:   Refer to the procedure report that was given to you for any specific questions about what was found during the examination.  If the procedure report does not answer your questions, please call your gastroenterologist to clarify.  If you requested that your care partner not be given the details of your procedure findings, then the procedure report has been included in a sealed envelope for you to review at your convenience later.  YOU SHOULD EXPECT: Some feelings of bloating in the abdomen. Passage of more gas than usual.  Walking can help get rid of the air that was put into your GI tract during the procedure and reduce the bloating.   Please Note:  You might notice some irritation and congestion in your nose or some drainage.  This is from the oxygen used during your procedure.  There is no need for concern and it should clear up in a day or so.  SYMPTOMS TO REPORT IMMEDIATELY:   Following upper endoscopy (EGD)  Vomiting of blood or coffee ground material  New chest pain or pain under the shoulder blades  Painful or persistently difficult swallowing  New shortness of breath  Fever of 100F or higher  Black, tarry-looking stools  For urgent or emergent issues, a gastroenterologist can be reached at any hour by calling [phone]. Do not use MyChart messaging for urgent concerns.    DIET:  We do recommend a small meal at first, but then you may proceed to your regular diet.  Drink plenty of fluids but you should avoid alcoholic beverages for 24 hours.  ACTIVITY:  You should plan to take it easy for the rest of today and you should NOT DRIVE or use heavy machinery until tomorrow (because of the sedation medicines used during the test).    FOLLOW UP: Our staff will call the number listed on your records the next business day following your  procedure.  We will call around 7:15- 8:00 am to check on you and address any questions or concerns that you may have regarding the information given to you following your procedure. If we do not reach you, we will leave a message.     If any biopsies were taken you will be contacted by phone or by letter within the next 1-3 weeks.  Please call us at [phone] if you have not heard about the biopsies in 3 weeks.    SIGNATURES/CONFIDENTIALITY: You and/or your care partner have signed paperwork which will be entered into your electronic medical record.  These signatures attest to the fact that that the information above on your After Visit Summary has been reviewed and is understood.  Full responsibility of the confidentiality of this discharge information lies with you and/or your care-partner.

## 2022-03-14 NOTE — Progress Notes (Signed)
Pt's states no medical or surgical changes since previsit or office visit. CW, NT obtained patient VS.

## 2022-03-15 ENCOUNTER — Telehealth: Payer: Self-pay

## 2022-03-15 NOTE — Telephone Encounter (Signed)
  Follow up Call-     03/14/2022    1:55 PM  Call back number  Post procedure Call Back phone  # [phone]  Permission to leave phone message Yes     Patient questions:  Do you have a fever, pain , or abdominal swelling? No. Pain Score  0 *  Have you tolerated food without any problems? Yes.    Have you been able to return to your normal activities? Yes.    Do you have any questions about your discharge instructions: Diet   No. Medications  No. Follow up visit  No.  Do you have questions or concerns about your Care? No.  Actions: * If pain score is 4 or above: No action needed, pain <4.

## 2022-03-18 ENCOUNTER — Encounter: Payer: Self-pay | Admitting: Gastroenterology

## 2022-05-19 ENCOUNTER — Other Ambulatory Visit: Payer: Self-pay | Admitting: Family Medicine

## 2022-05-19 DIAGNOSIS — R002 Palpitations: Secondary | ICD-10-CM

## 2022-05-19 NOTE — Telephone Encounter (Signed)
There are numerous 8 am appointments available in the next month. Please see if he can come in for one of those appointments. Thanks.

## 2022-05-19 NOTE — Telephone Encounter (Signed)
Patient returned our call. I read Adair Laundry, CMA's message to patient and scheduled patient to see Dr. [name] on 07/10/2022 (next available).  Patient states he will need a refill for this medication before 07/10/2022.

## 2022-05-19 NOTE — Telephone Encounter (Signed)
LMTCB. Pt has not been seen since 2022. He will need to schedule an appt for medication refills.

## 2022-05-22 ENCOUNTER — Encounter: Payer: Self-pay | Admitting: Family Medicine

## 2022-05-22 ENCOUNTER — Encounter: Payer: Self-pay | Admitting: Gastroenterology

## 2022-05-22 NOTE — Telephone Encounter (Signed)
Attempted to call and schedule appointment for medication refill.  Pt has not been seen since 2022.  Pt has an appointment with Dr. Biagio Quint on 07/10/22 but pt needs a refill before then.  Also sent a message on mychart asking pt to schedule an appointment for medication refill.

## 2022-05-24 ENCOUNTER — Encounter: Payer: Self-pay | Admitting: Family Medicine

## 2022-05-24 ENCOUNTER — Ambulatory Visit: Payer: Managed Care, Other (non HMO) | Admitting: Family Medicine

## 2022-05-24 VITALS — BP 122/78 | HR 67 | Temp 98.6°F | Ht 72.0 in | Wt 265.8 lb

## 2022-05-24 DIAGNOSIS — R002 Palpitations: Secondary | ICD-10-CM | POA: Diagnosis not present

## 2022-05-24 DIAGNOSIS — K219 Gastro-esophageal reflux disease without esophagitis: Secondary | ICD-10-CM | POA: Diagnosis not present

## 2022-05-24 MED ORDER — NEBIVOLOL HCL 2.5 MG PO TABS: 2.5000 mg | ORAL_TABLET | Freq: Every day | ORAL | 1 refills | Status: DC

## 2022-05-24 NOTE — Progress Notes (Signed)
  [name], MD Phone: [phone]  Eddie Lopez is a 42 y.o. male who presents today for follow-up.  Palpitations: Patient notes chronic history of periodic palpitations.  He had an EP study in the distant past that did not reveal anything significant.  He has been on Bystolic since then.  He does have some palpitations on a weekly basis.  No chest pain or shortness of breath.  GERD: Patient notes he switched GI physicians as his left.  He is on omeprazole and it works all day though he has breakthrough reflux at night.  No abdominal pain, blood in stool, or dysphagia.  He reports having a recent endoscopy.  Social History   Tobacco Use  Smoking Status Never  Smokeless Tobacco Never    Current Outpatient Medications on File Prior to Visit  Medication Sig Dispense Refill   omeprazole (PRILOSEC) 40 MG capsule Take 1 capsule (40 mg total) by mouth daily. 30 capsule 6   Vitamin D, Ergocalciferol, (DRISDOL) 1.25 MG (50000 UNIT) CAPS capsule Take 50,000 Units by mouth once a week.     No current facility-administered medications on file prior to visit.     ROS see history of present illness  Objective  Physical Exam Vitals:   05/24/22 1525  BP: 122/78  Pulse: 67  Temp: 98.6 F (37 C)  SpO2: 97%    BP Readings from Last 3 Encounters:  05/24/22 122/78  03/14/22 122/78  01/10/22 116/80   Wt Readings from Last 3 Encounters:  05/24/22 265 lb 12.8 oz (120.6 kg)  03/14/22 262 lb (118.8 kg)  01/10/22 262 lb (118.8 kg)    Physical Exam Constitutional:      General: He is not in acute distress.    Appearance: He is not diaphoretic.  Cardiovascular:     Rate and Rhythm: Normal rate and regular rhythm.     Heart sounds: Normal heart sounds.  Pulmonary:     Effort: Pulmonary effort is normal.     Breath sounds: Normal breath sounds.  Skin:    General: Skin is warm and dry.  Neurological:     Mental Status: He is alert.      Assessment/Plan: Please see  individual problem list.  Gastroesophageal reflux disease, unspecified whether esophagitis present Assessment & Plan: Chronic issue.  He will continue omeprazole 40 mg daily.  He will continue to see his specialist.  Patient notes they may be discussing doing a TIF procedure.   Palpitations Assessment & Plan: Chronic intermittent issue.  Patient will continue Bystolic 2.5 mg daily.  Refills provided.  Orders: -     Nebivolol HCl; Take 1 tablet (2.5 mg total) by mouth daily.  Dispense: 90 tablet; Refill: 1    Return in about 5 months (around 10/24/2022) for physical.   [name], MD [facility]

## 2022-05-24 NOTE — Assessment & Plan Note (Signed)
Chronic intermittent issue.  Patient will continue Bystolic 2.5 mg daily.  Refills provided.

## 2022-05-24 NOTE — Assessment & Plan Note (Signed)
Chronic issue.  He will continue omeprazole 40 mg daily.  He will continue to see his specialist.  Patient notes they may be discussing doing a TIF procedure.

## 2022-05-29 NOTE — Telephone Encounter (Signed)
I discussed with Dr. Bryan Lemma the case and I learned about the relative contraindication for patient to have EOE or PPI responsive EOE in regards to the use of the TIF procedure. As such it does make sense to go ahead and just move forward with surgical hiatal hernia repair and partial fundoplication for this individual who has known acid reflux as well as an anatomical barrier issue from a hiatal hernia standpoint. I spoke with the patient over the phone today at length to go over things and he is in agreement with moving forward with following up with Dr. Kae Heller. I will forward this to her as well to see if she is willing to see the patient back in follow-up again and see if they would move forward with surgical interventions. This would be in the hope of trying to come off medications versus decreasing dosing significantly. I appreciate Dr. Bryan Lemma reviewing this case.  CAC, You met this patient back in 2023. Was hoping that you would be able to reevaluate him and consider fundoplication and hiatal hernia repair. He is in agreement and ready to see you back in follow-up to discuss potential interventions if you still feel he is a candidate. Thank you. GM

## 2022-06-04 ENCOUNTER — Encounter: Payer: Self-pay | Admitting: Family Medicine

## 2022-07-10 ENCOUNTER — Ambulatory Visit: Payer: Managed Care, Other (non HMO) | Admitting: Family Medicine

## 2022-08-05 ENCOUNTER — Other Ambulatory Visit: Payer: Self-pay | Admitting: Gastroenterology

## 2022-08-15 ENCOUNTER — Ambulatory Visit: Payer: Managed Care, Other (non HMO) | Admitting: Gastroenterology

## 2022-08-28 ENCOUNTER — Ambulatory Visit: Payer: Self-pay | Admitting: Surgery

## 2022-08-28 NOTE — H&P (View-Only) (Signed)
Eddie Lopez [id number]  Referring Provider: Glori Luis, MD  Subjective  Chief Complaint: Hiatal Follow-up (Discuss SX)   History of Present Illness:  Following up to discuss surgical treatment of his reflux. Symptoms have persisted. Upper endoscopy 03/14/2022, Dr. Meridee Score: Eosinophilic esophagitis, Schatzki ring at GE junction, 3 cm hiatal hernia with Hill grade IV valve, some gastritis. Esophageal biopsies demonstrated squamous mucosa with mild reflux changes but negative for increased intraepithelial eosinophils, negative for dysplasia or malignancy; gastric biopsies negative for H. pylori. Discussion between Dr. Meridee Score and Dr. Barron Alvine as noted in [facility] epic; patient was hopeful to be candidate for endoscopic treatment however apparently eosinophilic esophagitis is a contraindication to TIF and the recommendation is to proceed with laparoscopic hiatal hernia repair with partial/toupet fundoplication.  He reports that his health is unchanged. He is now committed to proceeding with surgery.  Last visit- June 2023: Follow-up regarding reflux issues and possible antireflux surgery. He denies any changes in his health since our last visit. He is continue to work on weight loss with walking daily and improving his eating choices, but notes it has been slow going. He has completed the work-up we had previously discussed: pH-impedance probe: Demeester score 21.5, increased acid reflux episodes with positive symptom correlation Manometry: Normal relaxation of the GE junction, no significant esophageal peristaltic abnormality, no manometric evidence of a hiatal hernia  Initial visit March 31, 2021: this is a very pleasant 42 year old man with a longstanding history of reflux and eosinophilic esophagitis, Sullivan Lone syndrome, followed by Dr. Maximino Greenland at [facility] gastroenterology.  He has a longstanding history of classic GERD symptoms including burning and occasional pain in the  central chest, denies any history of dysphagia or food impactions, though he has been previously treated with steroids for his eosinophilic esophagitis which seems to be completely asymptomatic. Has also been considered for allergy testing and food illumination diet for his eosinophilic esophagitis. At last GI visit which was notable per plan was to refer to allergy immunology to discuss sputum emanation to treat his eosinophilic esophagitis. For the most part his reflux has been well controlled with regular medication and antireflux measures including trying to avoid late night eating although he does still sometimes do this, he does sleep on a wedge, and reports that specific foods he knows will aggravate his reflux. He has had worsening nocturnal GERD with a couple episodes of regurgitation/choking over the last few months despite compliance with PPI. Last endoscopy was April 2022 with Dr. Meridee Score: Eosinophilic esophagitis, irregular Z-line, 2 cm hiatal hernia. Esophageal biopsies noted reflux changes and mildly increased eosinophils. No previous manometry or pH probe.  He is interested in discussing surgical treatment options.  Otherwise he is in good health. Has been working diligently on weight loss and intends to lose some more weight. He works for [employer]. Very occasional alcohol, no tobacco or drug use.  Review of Systems: A complete review of systems was obtained from the patient. I have reviewed this information and discussed as appropriate with the patient. See HPI as well for other ROS.  Medical History: History reviewed. No pertinent past medical history.  There is no problem list on file for this patient.  History reviewed. No pertinent surgical history.  No Known Allergies  Current Outpatient Medications on File Prior to Visit Medication Sig Dispense Refill nebivoloL (BYSTOLIC) 2.5 MG tablet Bystolic 2.5 mg tablet omeprazole (PRILOSEC) 20 MG DR capsule omeprazole  20 mg capsule,delayed release dexlansoprazole (DEXILANT) 30 mg DR capsule  Take by mouth (Patient not taking: Reported on 06/15/2022)  No current facility-administered medications on file prior to visit.  History reviewed. No pertinent family history.  Social History  Tobacco Use Smoking Status Never Smokeless Tobacco Never   Social History  Socioeconomic History Marital status: Married Tobacco Use Smoking status: Never Smokeless tobacco: Never Vaping Use Vaping Use: Never used Substance and Sexual Activity Alcohol use: Yes Drug use: Never  Objective:  Vitals: 06/15/22 0951 PainSc: 0-No pain  There is no height or weight on file to calculate BMI.  alert, Well-appearing Unlabored respirations  Assessment and Plan: Diagnoses and all orders for this visit:  Hiatal hernia with gastroesophageal reflux   Fairly well-controlled daytime reflux symptoms but worsening nocturnal symptoms despite sleeping on a wedge and minimizing nighttime eating. At our last visits I discussed with him the technical aspects of hiatal hernia repair with Nissen versus toupet and relevant anatomy using a diagram to demonstrate. Discussed the typical postoperative course including initially pured diet with slow advancement back to regular foods. Discussed potential need for long-term change in eating habits. Went over risks involved with this surgery including bleeding, infection, pain, scarring, injury to structures in the mediastinum or abdomen, hiatal hernia recurrence, dysphagia which can be chronic, slipped or dehisced Nissen, gas bloat, as well as systemic risks of undergoing general anesthesia including cardiac/pulmonary and thromboembolic risks. We discussed anticipated benefit of being able to stop or at least decrease PPI use and improve quality of life from a reflux standpoint, and we discussed that further testing prior to surgery would be warranted to establish esophageal motility and  baseline for reflux preoperatively. He has completed pH impedance probe and manometry with findings above, and we discussed that his likelihood of symptomatic improvement with antireflux surgery is very high although certainly there is a risk of some trade-offs as outlined above.  We went over the above briefly again and all of his questions were answered today. I do recommend proceeding with robotic hiatal hernia repair with toupet fundoplication and he is agreeable to proceed. He has a vacation in early July and so would like to schedule surgery towards the end of July.  CHELSEA Carlye Grippe, MD

## 2022-08-28 NOTE — H&P (Signed)
Eddie Lopez [id number]  Referring Provider: Glori Luis, MD  Subjective  Chief Complaint: Hiatal Follow-up (Discuss SX)   History of Present Illness:  Following up to discuss surgical treatment of his reflux. Symptoms have persisted. Upper endoscopy 03/14/2022, Dr. Meridee Score: Eosinophilic esophagitis, Schatzki ring at GE junction, 3 cm hiatal hernia with Hill grade IV valve, some gastritis. Esophageal biopsies demonstrated squamous mucosa with mild reflux changes but negative for increased intraepithelial eosinophils, negative for dysplasia or malignancy; gastric biopsies negative for H. pylori. Discussion between Dr. Meridee Score and Dr. Barron Alvine as noted in [facility] epic; patient was hopeful to be candidate for endoscopic treatment however apparently eosinophilic esophagitis is a contraindication to TIF and the recommendation is to proceed with laparoscopic hiatal hernia repair with partial/toupet fundoplication.  He reports that his health is unchanged. He is now committed to proceeding with surgery.  Last visit- June 2023: Follow-up regarding reflux issues and possible antireflux surgery. He denies any changes in his health since our last visit. He is continue to work on weight loss with walking daily and improving his eating choices, but notes it has been slow going. He has completed the work-up we had previously discussed: pH-impedance probe: Demeester score 21.5, increased acid reflux episodes with positive symptom correlation Manometry: Normal relaxation of the GE junction, no significant esophageal peristaltic abnormality, no manometric evidence of a hiatal hernia  Initial visit March 31, 2021: this is a very pleasant 42 year old man with a longstanding history of reflux and eosinophilic esophagitis, Sullivan Lone syndrome, followed by Dr. Maximino Greenland at [facility] gastroenterology.  He has a longstanding history of classic GERD symptoms including burning and occasional pain in the  central chest, denies any history of dysphagia or food impactions, though he has been previously treated with steroids for his eosinophilic esophagitis which seems to be completely asymptomatic. Has also been considered for allergy testing and food illumination diet for his eosinophilic esophagitis. At last GI visit which was notable per plan was to refer to allergy immunology to discuss sputum emanation to treat his eosinophilic esophagitis. For the most part his reflux has been well controlled with regular medication and antireflux measures including trying to avoid late night eating although he does still sometimes do this, he does sleep on a wedge, and reports that specific foods he knows will aggravate his reflux. He has had worsening nocturnal GERD with a couple episodes of regurgitation/choking over the last few months despite compliance with PPI. Last endoscopy was April 2022 with Dr. Meridee Score: Eosinophilic esophagitis, irregular Z-line, 2 cm hiatal hernia. Esophageal biopsies noted reflux changes and mildly increased eosinophils. No previous manometry or pH probe.  He is interested in discussing surgical treatment options.  Otherwise he is in good health. Has been working diligently on weight loss and intends to lose some more weight. He works for [employer]. Very occasional alcohol, no tobacco or drug use.  Review of Systems: A complete review of systems was obtained from the patient. I have reviewed this information and discussed as appropriate with the patient. See HPI as well for other ROS.  Medical History: History reviewed. No pertinent past medical history.  There is no problem list on file for this patient.  History reviewed. No pertinent surgical history.  No Known Allergies  Current Outpatient Medications on File Prior to Visit Medication Sig Dispense Refill nebivoloL (BYSTOLIC) 2.5 MG tablet Bystolic 2.5 mg tablet omeprazole (PRILOSEC) 20 MG DR capsule omeprazole  20 mg capsule,delayed release dexlansoprazole (DEXILANT) 30 mg DR capsule  Take by mouth (Patient not taking: Reported on 06/15/2022)  No current facility-administered medications on file prior to visit.  History reviewed. No pertinent family history.  Social History  Tobacco Use Smoking Status Never Smokeless Tobacco Never   Social History  Socioeconomic History Marital status: Married Tobacco Use Smoking status: Never Smokeless tobacco: Never Vaping Use Vaping Use: Never used Substance and Sexual Activity Alcohol use: Yes Drug use: Never  Objective:  Vitals: 06/15/22 0951 PainSc: 0-No pain  There is no height or weight on file to calculate BMI.  alert, Well-appearing Unlabored respirations  Assessment and Plan: Diagnoses and all orders for this visit:  Hiatal hernia with gastroesophageal reflux   Fairly well-controlled daytime reflux symptoms but worsening nocturnal symptoms despite sleeping on a wedge and minimizing nighttime eating. At our last visits I discussed with him the technical aspects of hiatal hernia repair with Nissen versus toupet and relevant anatomy using a diagram to demonstrate. Discussed the typical postoperative course including initially pured diet with slow advancement back to regular foods. Discussed potential need for long-term change in eating habits. Went over risks involved with this surgery including bleeding, infection, pain, scarring, injury to structures in the mediastinum or abdomen, hiatal hernia recurrence, dysphagia which can be chronic, slipped or dehisced Nissen, gas bloat, as well as systemic risks of undergoing general anesthesia including cardiac/pulmonary and thromboembolic risks. We discussed anticipated benefit of being able to stop or at least decrease PPI use and improve quality of life from a reflux standpoint, and we discussed that further testing prior to surgery would be warranted to establish esophageal motility and  baseline for reflux preoperatively. He has completed pH impedance probe and manometry with findings above, and we discussed that his likelihood of symptomatic improvement with antireflux surgery is very high although certainly there is a risk of some trade-offs as outlined above.  We went over the above briefly again and all of his questions were answered today. I do recommend proceeding with robotic hiatal hernia repair with toupet fundoplication and he is agreeable to proceed. He has a vacation in early July and so would like to schedule surgery towards the end of July.  Tjay Velazquez Carlye Grippe, MD

## 2022-09-04 ENCOUNTER — Other Ambulatory Visit: Payer: Self-pay | Admitting: Gastroenterology

## 2022-09-05 NOTE — Progress Notes (Addendum)
PCP - [name] LOV 05-24-22 Cardiologist - Last saw 2018 [name], MD [place] , [place]  for palpitations no issues now  PPM/ICD -  Device Orders -  Rep Notified -   Chest x-ray -  EKG -  Stress Test -  ECHO - 10-18-21  epic Cardiac Cath -   Sleep Study -  CPAP -   Fasting Blood Sugar -  Checks Blood Sugar _____ times a day  Blood Thinner Instructions: Aspirin Instructions:  ERAS Protcol - PRE-SURGERY Ensure or G2-    COVID vaccine -  Activity--Able to climb a flight of stairs without SOB or CP Anesthesia review: HTN, GERD  Patient denies shortness of breath, fever, cough and chest pain at PAT appointment   All instructions explained to the patient, with a verbal understanding of the material. Patient agrees to go over the instructions while at home for a better understanding. Patient also instructed to self quarantine after being tested for COVID-19. The opportunity to ask questions was provided.

## 2022-09-05 NOTE — Patient Instructions (Addendum)
SURGICAL WAITING ROOM VISITATION  Patients having surgery or a procedure may have no more than 2 support people in the waiting area - these visitors may rotate.    Children under the age of 71 must have an adult with them who is not the patient.   If the patient needs to stay at the hospital during part of their recovery, the visitor guidelines for inpatient rooms apply. Pre-op nurse will coordinate an appropriate time for 1 support person to accompany patient in pre-op.  This support person may not rotate.    Please refer to the [employer] website for the visitor guidelines for Inpatients (after your surgery is over and you are in a regular room).       Your procedure is scheduled on: 09-26-22    Report to [facility] Main Entrance    Report to admitting at       0515  AM   Call this number if you have problems the morning of surgery [phone]   Do not eat food  or drink liquids :After Midnight.                            If you have questions, please contact your surgeon's office.   FOLLOW ANY ADDITIONAL PRE OP INSTRUCTIONS YOU RECEIVED FROM YOUR SURGEON'S OFFICE!!!     Oral Hygiene is also important to reduce your risk of infection.                                    Remember - BRUSH YOUR TEETH THE MORNING OF SURGERY WITH YOUR REGULAR TOOTHPASTE  DENTURES WILL BE REMOVED PRIOR TO SURGERY PLEASE DO NOT APPLY "Poly grip" OR ADHESIVES!!!   Do NOT smoke after Midnight   Take these medicines the morning of surgery with A SIP OF WATER: nebivolol, omeprazole                                You may not have any metal on your body including hair pins, jewelry, and body piercing             Do not wear lotions, powders, perfumes/cologne, or deodorant                 Men may shave face and neck.   Do not bring valuables to the hospital. [facility] IS NOT             RESPONSIBLE   FOR VALUABLES.   Contacts, glasses, dentures or bridgework may not be worn into  surgery.     DO NOT BRING YOUR HOME MEDICATIONS TO THE HOSPITAL. PHARMACY WILL DISPENSE MEDICATIONS LISTED ON YOUR MEDICATION LIST TO YOU DURING YOUR ADMISSION IN THE HOSPITAL!    Patients discharged on the day of surgery will not be allowed to drive home.  Someone NEEDS to stay with you for the first 24 hours after anesthesia.   Special Instructions: Bring a copy of your healthcare power of attorney and living will documents the day of surgery if you haven't scanned them before.              Please read over the following fact sheets you were given: IF YOU HAVE QUESTIONS ABOUT YOUR PRE-OP INSTRUCTIONS PLEASE CALL [phone]    If you test positive for  Covid or have been in contact with anyone that has tested positive in the last 10 days please notify you surgeon.    [facility] - Preparing for Surgery Before surgery, you can play an important role.  Because skin is not sterile, your skin needs to be as free of germs as possible.  You can reduce the number of germs on your skin by washing with CHG (chlorahexidine gluconate) soap before surgery.  CHG is an antiseptic cleaner which kills germs and bonds with the skin to continue killing germs even after washing. Please DO NOT use if you have an allergy to CHG or antibacterial soaps.  If your skin becomes reddened/irritated stop using the CHG and inform your nurse when you arrive at Short Stay. Do not shave (including legs and underarms) for at least 48 hours prior to the first CHG shower.  You may shave your face/neck. Please follow these instructions carefully:  1.  Shower with CHG Soap the night before surgery and the  morning of Surgery.  2.  If you choose to wash your hair, wash your hair first as usual with your  normal  shampoo.  3.  After you shampoo, rinse your hair and body thoroughly to remove the  shampoo.                           4.  Use CHG as you would any other liquid soap.  You can apply chg directly  to the skin and wash                        Gently with a scrungie or clean washcloth.  5.  Apply the CHG Soap to your body ONLY FROM THE NECK DOWN.   Do not use on face/ open                           Wound or open sores. Avoid contact with eyes, ears mouth and genitals (private parts).                       Wash face,  Genitals (private parts) with your normal soap.             6.  Wash thoroughly, paying special attention to the area where your surgery  will be performed.  7.  Thoroughly rinse your body with warm water from the neck down.  8.  DO NOT shower/wash with your normal soap after using and rinsing off  the CHG Soap.                9.  Pat yourself dry with a clean towel.            10.  Wear clean pajamas.            11.  Place clean sheets on your bed the night of your first shower and do not  sleep with pets. Day of Surgery : Do not apply any lotions/deodorants the morning of surgery.  Please wear clean clothes to the hospital/surgery center.  FAILURE TO FOLLOW THESE INSTRUCTIONS MAY RESULT IN THE CANCELLATION OF YOUR SURGERY PATIENT SIGNATURE_________________________________  NURSE SIGNATURE__________________________________  ________________________________________________________________________

## 2022-09-08 ENCOUNTER — Other Ambulatory Visit: Payer: Self-pay

## 2022-09-08 ENCOUNTER — Encounter (HOSPITAL_COMMUNITY)
Admission: RE | Admit: 2022-09-08 | Discharge: 2022-09-08 | Disposition: A | Discharge status: Home / Self Care | Payer: Managed Care, Other (non HMO) | Source: Ambulatory Visit | Attending: Surgery | Admitting: Surgery

## 2022-09-08 ENCOUNTER — Encounter (HOSPITAL_COMMUNITY): Payer: Self-pay

## 2022-09-08 VITALS — BP 131/93 | HR 54 | Temp 98.2°F | Resp 16 | Ht 73.0 in | Wt 267.0 lb

## 2022-09-08 DIAGNOSIS — I10 Essential (primary) hypertension: Secondary | ICD-10-CM | POA: Diagnosis not present

## 2022-09-08 DIAGNOSIS — I517 Cardiomegaly: Secondary | ICD-10-CM | POA: Diagnosis not present

## 2022-09-08 DIAGNOSIS — R001 Bradycardia, unspecified: Secondary | ICD-10-CM | POA: Insufficient documentation

## 2022-09-08 DIAGNOSIS — Z01818 Encounter for other preprocedural examination: Secondary | ICD-10-CM | POA: Diagnosis present

## 2022-09-08 HISTORY — DX: Personal history of other diseases of the digestive system: Z87.19

## 2022-09-08 LAB — CBC
HCT: 45.5 % (ref 39.0–52.0)
Hemoglobin: 15.3 g/dL (ref 13.0–17.0)
MCH: 28.6 pg (ref 26.0–34.0)
MCHC: 33.6 g/dL (ref 30.0–36.0)
MCV: 85 fL (ref 80.0–100.0)
Platelets: 161 10*3/uL (ref 150–400)
RBC: 5.35 MIL/uL (ref 4.22–5.81)
RDW: 12.8 % (ref 11.5–15.5)
WBC: 4.5 10*3/uL (ref 4.0–10.5)
nRBC: 0 % (ref 0.0–0.2)

## 2022-09-08 LAB — BASIC METABOLIC PANEL
Anion gap: 8 (ref 5–15)
BUN: 12 mg/dL (ref 6–20)
CO2: 25 mmol/L (ref 22–32)
Calcium: 9.6 mg/dL (ref 8.9–10.3)
Chloride: 103 mmol/L (ref 98–111)
Creatinine, Ser: 0.8 mg/dL (ref 0.61–1.24)
GFR, Estimated: >60 mL/min (ref >60)
Glucose, Bld: 98 mg/dL (ref 70–99)
Potassium: 3.8 mmol/L (ref 3.5–5.1)
Sodium: 136 mmol/L (ref 135–145)

## 2022-09-25 NOTE — Anesthesia Preprocedure Evaluation (Signed)
Anesthesia Evaluation  Patient identified by MRN, date of birth, ID band Patient awake    Reviewed: Allergy & Precautions, NPO status , Patient's Chart, lab work & pertinent test results, reviewed documented beta blocker date and time   History of Anesthesia Complications Negative for: history of anesthetic complications  Airway Mallampati: III  TM Distance: >3 FB Neck ROM: Full    Dental  (+) Dental Advisory Given   Pulmonary neg pulmonary ROS   breath sounds clear to auscultation       Cardiovascular hypertension, Pt. on medications and Pt. on home beta blockers (-) angina  Rhythm:Regular Rate:Normal     Neuro/Psych  Headaches    GI/Hepatic Neg liver ROS, hiatal hernia,GERD  Medicated and Poorly Controlled,,  Endo/Other  BMI 35  Renal/GU negative Renal ROS     Musculoskeletal   Abdominal  (+) + obese  Peds  Hematology negative hematology ROS (+)   Anesthesia Other Findings   Reproductive/Obstetrics                             Anesthesia Physical Anesthesia Plan  ASA: 2  Anesthesia Plan: General   Post-op Pain Management: Tylenol PO (pre-op)*   Induction: Intravenous and Rapid sequence  PONV Risk Score and Plan: 2 and Ondansetron and Dexamethasone  Airway Management Planned: Oral ETT and Video Laryngoscope Planned  Additional Equipment: None  Intra-op Plan:   Post-operative Plan: Extubation in OR  Informed Consent: I have reviewed the patients History and Physical, chart, labs and discussed the procedure including the risks, benefits and alternatives for the proposed anesthesia with the patient or authorized representative who has indicated his/her understanding and acceptance.     Dental advisory given  Plan Discussed with: CRNA and Surgeon  Anesthesia Plan Comments:         Anesthesia Quick Evaluation

## 2022-09-26 ENCOUNTER — Ambulatory Visit (HOSPITAL_BASED_OUTPATIENT_CLINIC_OR_DEPARTMENT_OTHER): Payer: Managed Care, Other (non HMO) | Admitting: Anesthesiology

## 2022-09-26 ENCOUNTER — Other Ambulatory Visit: Payer: Self-pay

## 2022-09-26 ENCOUNTER — Encounter (HOSPITAL_COMMUNITY): Payer: Self-pay | Admitting: Surgery

## 2022-09-26 ENCOUNTER — Ambulatory Visit (HOSPITAL_COMMUNITY)
Admission: RE | Admit: 2022-09-26 | Discharge: 2022-09-27 | Disposition: A | Discharge status: Home / Self Care | Payer: Managed Care, Other (non HMO) | Attending: Surgery | Admitting: Surgery

## 2022-09-26 ENCOUNTER — Ambulatory Visit (HOSPITAL_COMMUNITY): Payer: Managed Care, Other (non HMO) | Admitting: Anesthesiology

## 2022-09-26 ENCOUNTER — Encounter (HOSPITAL_COMMUNITY)
Admission: RE | Disposition: A | Discharge status: Home / Self Care | Payer: Self-pay | Source: Home / Self Care | Attending: Surgery

## 2022-09-26 DIAGNOSIS — K2970 Gastritis, unspecified, without bleeding: Secondary | ICD-10-CM | POA: Insufficient documentation

## 2022-09-26 DIAGNOSIS — K222 Esophageal obstruction: Secondary | ICD-10-CM | POA: Diagnosis not present

## 2022-09-26 DIAGNOSIS — Z79899 Other long term (current) drug therapy: Secondary | ICD-10-CM | POA: Diagnosis not present

## 2022-09-26 DIAGNOSIS — K219 Gastro-esophageal reflux disease without esophagitis: Secondary | ICD-10-CM | POA: Insufficient documentation

## 2022-09-26 DIAGNOSIS — K449 Diaphragmatic hernia without obstruction or gangrene: Secondary | ICD-10-CM

## 2022-09-26 DIAGNOSIS — Z8719 Personal history of other diseases of the digestive system: Secondary | ICD-10-CM

## 2022-09-26 DIAGNOSIS — K200 Eosinophilic esophagitis: Secondary | ICD-10-CM | POA: Insufficient documentation

## 2022-09-26 HISTORY — PX: XI ROBOTIC ASSISTED PARAESOPHAGEAL HERNIA REPAIR: SHX6871

## 2022-09-26 SURGERY — REPAIR, HERNIA, PARAESOPHAGEAL, ROBOT-ASSISTED
Anesthesia: General

## 2022-09-26 MED ORDER — DIPHENHYDRAMINE HCL 25 MG PO CAPS: 25.0000 mg | ORAL_CAPSULE | Freq: Four times a day (QID) | ORAL | Status: DC | PRN

## 2022-09-26 MED ORDER — PHENYLEPHRINE HCL (PRESSORS) 10 MG/ML IV SOLN
INTRAVENOUS | Status: DC | PRN
  Administered 2022-09-26 (×3): 80 ug via INTRAVENOUS

## 2022-09-26 MED ORDER — PROPOFOL 10 MG/ML IV BOLUS
INTRAVENOUS | Status: AC
  Filled 2022-09-26: qty 20

## 2022-09-26 MED ORDER — HYDROMORPHONE HCL 1 MG/ML IJ SOLN: 0.5000 mg | INTRAMUSCULAR | Status: DC | PRN

## 2022-09-26 MED ORDER — MEPERIDINE HCL 50 MG/ML IJ SOLN: 6.2500 mg | INTRAMUSCULAR | Status: DC | PRN

## 2022-09-26 MED ORDER — METHOCARBAMOL 1000 MG/10ML IJ SOLN: 500.0000 mg | Freq: Four times a day (QID) | INTRAVENOUS | Status: DC | PRN

## 2022-09-26 MED ORDER — OXYCODONE HCL 5 MG PO TABS: 5.0000 mg | ORAL_TABLET | Freq: Four times a day (QID) | ORAL | Status: DC | PRN

## 2022-09-26 MED ORDER — ACETAMINOPHEN 500 MG PO TABS: 1000.0000 mg | ORAL_TABLET | ORAL | Status: DC

## 2022-09-26 MED ORDER — SUGAMMADEX SODIUM 200 MG/2ML IV SOLN
INTRAVENOUS | Status: DC | PRN
  Administered 2022-09-26: 200 mg via INTRAVENOUS

## 2022-09-26 MED ORDER — GABAPENTIN 300 MG PO CAPS
300.0000 mg | ORAL_CAPSULE | ORAL | Status: AC
  Administered 2022-09-26: 300 mg via ORAL
  Filled 2022-09-26: qty 1

## 2022-09-26 MED ORDER — NEBIVOLOL HCL 2.5 MG PO TABS
2.5000 mg | ORAL_TABLET | Freq: Every day | ORAL | Status: DC
  Administered 2022-09-27: 2.5 mg via ORAL
  Filled 2022-09-26: qty 1

## 2022-09-26 MED ORDER — PANTOPRAZOLE SODIUM 40 MG IV SOLR
40.0000 mg | Freq: Every day | INTRAVENOUS | Status: DC
  Administered 2022-09-26: 40 mg via INTRAVENOUS
  Filled 2022-09-26: qty 10

## 2022-09-26 MED ORDER — FENTANYL CITRATE (PF) 250 MCG/5ML IJ SOLN
INTRAMUSCULAR | Status: AC
  Filled 2022-09-26: qty 5

## 2022-09-26 MED ORDER — OXYCODONE HCL 5 MG PO TABS: 5.0000 mg | ORAL_TABLET | Freq: Once | ORAL | Status: DC | PRN

## 2022-09-26 MED ORDER — SIMETHICONE 80 MG PO CHEW
40.0000 mg | CHEWABLE_TABLET | Freq: Four times a day (QID) | ORAL | Status: DC | PRN
  Administered 2022-09-26: 40 mg via ORAL
  Filled 2022-09-26 (×2): qty 1

## 2022-09-26 MED ORDER — CHLORHEXIDINE GLUCONATE 4 % EX SOLN: 60.0000 mL | Freq: Once | CUTANEOUS | Status: DC

## 2022-09-26 MED ORDER — LACTATED RINGERS IV SOLN: INTRAVENOUS | Status: DC | PRN

## 2022-09-26 MED ORDER — ORAL CARE MOUTH RINSE: 15.0000 mL | Freq: Once | OROMUCOSAL | Status: AC

## 2022-09-26 MED ORDER — 0.9 % SODIUM CHLORIDE (POUR BTL) OPTIME
TOPICAL | Status: DC | PRN
  Administered 2022-09-26: 1000 mL

## 2022-09-26 MED ORDER — FENTANYL CITRATE (PF) 100 MCG/2ML IJ SOLN
INTRAMUSCULAR | Status: DC | PRN
  Administered 2022-09-26 (×2): 50 ug via INTRAVENOUS
  Administered 2022-09-26: 100 ug via INTRAVENOUS
  Administered 2022-09-26: 50 ug via INTRAVENOUS

## 2022-09-26 MED ORDER — DOCUSATE SODIUM 100 MG PO CAPS
100.0000 mg | ORAL_CAPSULE | Freq: Two times a day (BID) | ORAL | Status: DC
  Administered 2022-09-26 – 2022-09-27 (×3): 100 mg via ORAL
  Filled 2022-09-26 (×3): qty 1

## 2022-09-26 MED ORDER — LACTATED RINGERS IR SOLN
Status: DC | PRN
  Administered 2022-09-26: 1000 mL

## 2022-09-26 MED ORDER — ACETAMINOPHEN 500 MG PO TABS
1000.0000 mg | ORAL_TABLET | Freq: Once | ORAL | Status: AC
  Administered 2022-09-26: 1000 mg via ORAL
  Filled 2022-09-26: qty 2

## 2022-09-26 MED ORDER — HEPARIN SODIUM (PORCINE) 5000 UNIT/ML IJ SOLN
5000.0000 [IU] | Freq: Three times a day (TID) | INTRAMUSCULAR | Status: DC
  Administered 2022-09-26 – 2022-09-27 (×2): 5000 [IU] via SUBCUTANEOUS
  Filled 2022-09-26 (×2): qty 1

## 2022-09-26 MED ORDER — MIDAZOLAM HCL 2 MG/2ML IJ SOLN
INTRAMUSCULAR | Status: AC
  Filled 2022-09-26: qty 2

## 2022-09-26 MED ORDER — LIDOCAINE HCL (CARDIAC) PF 100 MG/5ML IV SOSY
PREFILLED_SYRINGE | INTRAVENOUS | Status: DC | PRN
  Administered 2022-09-26: 80 mg via INTRAVENOUS

## 2022-09-26 MED ORDER — BISACODYL 10 MG RE SUPP: 10.0000 mg | Freq: Every day | RECTAL | Status: DC | PRN

## 2022-09-26 MED ORDER — KETOROLAC TROMETHAMINE 15 MG/ML IJ SOLN: 15.0000 mg | Freq: Four times a day (QID) | INTRAMUSCULAR | Status: DC | PRN

## 2022-09-26 MED ORDER — ROCURONIUM BROMIDE 100 MG/10ML IV SOLN
INTRAVENOUS | Status: DC | PRN
  Administered 2022-09-26 (×3): 20 mg via INTRAVENOUS
  Administered 2022-09-26: 40 mg via INTRAVENOUS

## 2022-09-26 MED ORDER — PROMETHAZINE HCL 25 MG/ML IJ SOLN: 6.2500 mg | INTRAMUSCULAR | Status: DC | PRN

## 2022-09-26 MED ORDER — HYDRALAZINE HCL 20 MG/ML IJ SOLN: 10.0000 mg | INTRAMUSCULAR | Status: DC | PRN

## 2022-09-26 MED ORDER — GABAPENTIN 100 MG PO CAPS
300.0000 mg | ORAL_CAPSULE | Freq: Two times a day (BID) | ORAL | Status: DC
  Administered 2022-09-26 – 2022-09-27 (×3): 300 mg via ORAL
  Filled 2022-09-26 (×3): qty 3

## 2022-09-26 MED ORDER — ONDANSETRON HCL 4 MG/2ML IJ SOLN
INTRAMUSCULAR | Status: DC | PRN
  Administered 2022-09-26: 4 mg via INTRAVENOUS

## 2022-09-26 MED ORDER — ONDANSETRON 4 MG PO TBDP: 4.0000 mg | ORAL_TABLET | Freq: Four times a day (QID) | ORAL | Status: DC | PRN

## 2022-09-26 MED ORDER — TRAMADOL HCL 50 MG PO TABS
50.0000 mg | ORAL_TABLET | Freq: Four times a day (QID) | ORAL | Status: DC | PRN
  Filled 2022-09-26: qty 1

## 2022-09-26 MED ORDER — METOCLOPRAMIDE HCL 5 MG/ML IJ SOLN
10.0000 mg | Freq: Four times a day (QID) | INTRAMUSCULAR | Status: DC
  Administered 2022-09-26 – 2022-09-27 (×5): 10 mg via INTRAVENOUS
  Filled 2022-09-26 (×5): qty 2

## 2022-09-26 MED ORDER — HYDROMORPHONE HCL 1 MG/ML IJ SOLN
INTRAMUSCULAR | Status: AC
  Filled 2022-09-26: qty 1

## 2022-09-26 MED ORDER — OXYCODONE HCL 5 MG/5ML PO SOLN: 5.0000 mg | Freq: Once | ORAL | Status: DC | PRN

## 2022-09-26 MED ORDER — SUCCINYLCHOLINE CHLORIDE 200 MG/10ML IV SOSY
PREFILLED_SYRINGE | INTRAVENOUS | Status: DC | PRN
  Administered 2022-09-26: 180 mg via INTRAVENOUS

## 2022-09-26 MED ORDER — PROPOFOL 10 MG/ML IV BOLUS
INTRAVENOUS | Status: DC | PRN
  Administered 2022-09-26: 200 mg via INTRAVENOUS

## 2022-09-26 MED ORDER — ACETAMINOPHEN 500 MG PO TABS
1000.0000 mg | ORAL_TABLET | Freq: Four times a day (QID) | ORAL | Status: DC
  Administered 2022-09-26 – 2022-09-27 (×5): 1000 mg via ORAL
  Filled 2022-09-26 (×5): qty 2

## 2022-09-26 MED ORDER — CEFAZOLIN SODIUM-DEXTROSE 2-4 GM/100ML-% IV SOLN
2.0000 g | INTRAVENOUS | Status: AC
  Administered 2022-09-26: 3 g via INTRAVENOUS
  Filled 2022-09-26: qty 100

## 2022-09-26 MED ORDER — LACTATED RINGERS IV SOLN: INTRAVENOUS | Status: DC

## 2022-09-26 MED ORDER — DIPHENHYDRAMINE HCL 50 MG/ML IJ SOLN: 25.0000 mg | Freq: Four times a day (QID) | INTRAMUSCULAR | Status: DC | PRN

## 2022-09-26 MED ORDER — MIDAZOLAM HCL 5 MG/5ML IJ SOLN
INTRAMUSCULAR | Status: DC | PRN
  Administered 2022-09-26: 2 mg via INTRAVENOUS

## 2022-09-26 MED ORDER — EPHEDRINE SULFATE (PRESSORS) 50 MG/ML IJ SOLN
INTRAMUSCULAR | Status: DC | PRN
  Administered 2022-09-26: 10 mg via INTRAVENOUS

## 2022-09-26 MED ORDER — ONDANSETRON HCL 4 MG/2ML IJ SOLN
4.0000 mg | Freq: Four times a day (QID) | INTRAMUSCULAR | Status: DC | PRN
  Filled 2022-09-26: qty 2

## 2022-09-26 MED ORDER — CEFAZOLIN SODIUM 1 G IJ SOLR
INTRAMUSCULAR | Status: AC
  Filled 2022-09-26: qty 10

## 2022-09-26 MED ORDER — SODIUM CHLORIDE 0.9 % IV SOLN: INTRAVENOUS | Status: DC

## 2022-09-26 MED ORDER — BUPIVACAINE-EPINEPHRINE 0.25% -1:200000 IJ SOLN
INTRAMUSCULAR | Status: AC
  Filled 2022-09-26: qty 1

## 2022-09-26 MED ORDER — HYDROMORPHONE HCL 1 MG/ML IJ SOLN
0.2500 mg | INTRAMUSCULAR | Status: DC | PRN
  Administered 2022-09-26: 0.5 mg via INTRAVENOUS

## 2022-09-26 MED ORDER — BUPIVACAINE-EPINEPHRINE 0.25% -1:200000 IJ SOLN
INTRAMUSCULAR | Status: DC | PRN
  Administered 2022-09-26: 50 mL

## 2022-09-26 MED ORDER — MIDAZOLAM HCL 2 MG/2ML IJ SOLN: 0.5000 mg | Freq: Once | INTRAMUSCULAR | Status: DC | PRN

## 2022-09-26 MED ORDER — DEXAMETHASONE SODIUM PHOSPHATE 4 MG/ML IJ SOLN
INTRAMUSCULAR | Status: DC | PRN
  Administered 2022-09-26: 8 mg via INTRAVENOUS

## 2022-09-26 MED ORDER — CHLORHEXIDINE GLUCONATE 0.12 % MT SOLN
15.0000 mL | Freq: Once | OROMUCOSAL | Status: AC
  Administered 2022-09-26: 15 mL via OROMUCOSAL

## 2022-09-26 MED ORDER — METOPROLOL TARTRATE 5 MG/5ML IV SOLN: 5.0000 mg | Freq: Four times a day (QID) | INTRAVENOUS | Status: DC | PRN

## 2022-09-26 MED ORDER — BUPIVACAINE LIPOSOME 1.3 % IJ SUSP: 20.0000 mL | Freq: Once | INTRAMUSCULAR | Status: DC

## 2022-09-26 SURGICAL SUPPLY — 70 items
ADH SKN CLS APL DERMABOND .7 (GAUZE/BANDAGES/DRESSINGS) ×1
ANTIFOG SOL W/FOAM PAD STRL (MISCELLANEOUS) ×1
APL PRP STRL LF DISP 70% ISPRP (MISCELLANEOUS) ×1
APPLIER CLIP 5 13 M/L LIGAMAX5 (MISCELLANEOUS)
APPLIER CLIP ROT 10 11.4 M/L (STAPLE)
APR CLP MED LRG 11.4X10 (STAPLE)
APR CLP MED LRG 5 ANG JAW (MISCELLANEOUS)
BAG COUNTER SPONGE SURGICOUNT (BAG) ×1 IMPLANT
BAG SPNG CNTER NS LX DISP (BAG) ×1
BLADE SURG SZ11 CARB STEEL (BLADE) ×1 IMPLANT
CHLORAPREP W/TINT 26 (MISCELLANEOUS) ×1 IMPLANT
CLIP APPLIE 5 13 M/L LIGAMAX5 (MISCELLANEOUS) IMPLANT
CLIP APPLIE ROT 10 11.4 M/L (STAPLE) IMPLANT
COVER SURGICAL LIGHT HANDLE (MISCELLANEOUS) ×1 IMPLANT
COVER TIP SHEARS 8 DVNC (MISCELLANEOUS) IMPLANT
DERMABOND ADVANCED .7 DNX12 (GAUZE/BANDAGES/DRESSINGS) ×1 IMPLANT
DRAIN PENROSE 0.5X18 (DRAIN) IMPLANT
DRAPE ARM DVNC X/XI (DISPOSABLE) ×4 IMPLANT
DRAPE COLUMN DVNC XI (DISPOSABLE) ×1 IMPLANT
DRIVER NDL LRG 8 DVNC XI (INSTRUMENTS) ×2 IMPLANT
DRIVER NDL MEGA SUTCUT DVNCXI (INSTRUMENTS) ×1 IMPLANT
DRIVER NDLE LRG 8 DVNC XI (INSTRUMENTS) ×2 IMPLANT
DRIVER NDLE MEGA SUTCUT DVNCXI (INSTRUMENTS) ×1 IMPLANT
ELECT REM PT RETURN 15FT ADLT (MISCELLANEOUS) ×1 IMPLANT
ENDOLOOP SUT PDS II 0 18 (SUTURE) IMPLANT
GAUZE 4X4 16PLY ~~LOC~~+RFID DBL (SPONGE) ×1 IMPLANT
GLOVE BIO SURGEON STRL SZ 6 (GLOVE) ×3 IMPLANT
GLOVE INDICATOR 6.5 STRL GRN (GLOVE) ×3 IMPLANT
GLOVE SS BIOGEL STRL SZ 6 (GLOVE) ×1 IMPLANT
GOWN STRL REUS W/ TWL LRG LVL3 (GOWN DISPOSABLE) ×2 IMPLANT
GOWN STRL REUS W/ TWL XL LVL3 (GOWN DISPOSABLE) IMPLANT
GOWN STRL REUS W/TWL LRG LVL3 (GOWN DISPOSABLE) ×2
GOWN STRL REUS W/TWL XL LVL3 (GOWN DISPOSABLE)
GRASPER TIP-UP FEN DVNC XI (INSTRUMENTS) ×1 IMPLANT
IRRIG SUCT STRYKERFLOW 2 WTIP (MISCELLANEOUS) ×1
IRRIGATION SUCT STRKRFLW 2 WTP (MISCELLANEOUS) ×1 IMPLANT
KIT BASIN OR (CUSTOM PROCEDURE TRAY) ×1 IMPLANT
KIT TURNOVER KIT A (KITS) IMPLANT
LUBRICANT JELLY K Y 4OZ (MISCELLANEOUS) IMPLANT
MARKER SKIN DUAL TIP RULER LAB (MISCELLANEOUS) ×1 IMPLANT
MESH BIO-A 7X10 SYN MAT (Mesh General) IMPLANT
NDL HYPO 22X1.5 SAFETY MO (MISCELLANEOUS) ×1 IMPLANT
NDL INSUFFLATION 14GA 120MM (NEEDLE) ×1 IMPLANT
NEEDLE HYPO 22X1.5 SAFETY MO (MISCELLANEOUS) ×1 IMPLANT
NEEDLE INSUFFLATION 14GA 120MM (NEEDLE) ×1 IMPLANT
PACK CARDIOVASCULAR III (CUSTOM PROCEDURE TRAY) ×1 IMPLANT
PAD POSITIONING PINK XL (MISCELLANEOUS) ×1 IMPLANT
RETRACTOR GRSP SML 8 DVNC XI (INSTRUMENTS) ×1 IMPLANT
SCISSORS LAP 5X35 DISP (ENDOMECHANICALS) IMPLANT
SCISSORS MNPLR CVD DVNC XI (INSTRUMENTS) ×1 IMPLANT
SEAL UNIV 5-12 XI (MISCELLANEOUS) ×3 IMPLANT
SEALER VESSEL EXT DVNC XI (MISCELLANEOUS) ×1 IMPLANT
SOL ELECTROSURG ANTI STICK (MISCELLANEOUS) ×1
SOLUTION ANTFG W/FOAM PAD STRL (MISCELLANEOUS) ×1 IMPLANT
SOLUTION ELECTROSURG ANTI STCK (MISCELLANEOUS) ×1 IMPLANT
SPIKE FLUID TRANSFER (MISCELLANEOUS) ×1 IMPLANT
SUT ETHIBOND 0 36 GRN (SUTURE) ×2 IMPLANT
SUT MNCRL AB 4-0 PS2 18 (SUTURE) ×1 IMPLANT
SUT SILK 0 SH 30 (SUTURE) IMPLANT
SUT SILK 2 0 SH (SUTURE) IMPLANT
SYR 20ML ECCENTRIC (SYRINGE) IMPLANT
SYR 20ML LL LF (SYRINGE) ×1 IMPLANT
TIP INNERVISION DETACH 40FR (MISCELLANEOUS) IMPLANT
TIP INNERVISION DETACH 50FR (MISCELLANEOUS) IMPLANT
TIP INNERVISION DETACH 56FR (MISCELLANEOUS) IMPLANT
TOWEL OR 17X26 10 PK STRL BLUE (TOWEL DISPOSABLE) ×1 IMPLANT
TOWEL OR NON WOVEN STRL DISP B (DISPOSABLE) ×1 IMPLANT
TRAY FOLEY MTR SLVR 16FR STAT (SET/KITS/TRAYS/PACK) IMPLANT
TROCAR ADV FIXATION 5X100MM (TROCAR) ×1 IMPLANT
TUBING INSUFFLATION 10FT LAP (TUBING) ×1 IMPLANT

## 2022-09-26 NOTE — Anesthesia Postprocedure Evaluation (Signed)
Anesthesia Post Note  Patient: Eddie Lopez  Procedure(s) Performed: ROBOTIC HIATAL HERNIA REPAIR WITH FUNDOPLICATION     Patient location during evaluation: PACU Anesthesia Type: General Level of consciousness: awake and alert, patient cooperative and oriented Pain management: pain level controlled Vital Signs Assessment: post-procedure vital signs reviewed and stable Respiratory status: spontaneous breathing, nonlabored ventilation and respiratory function stable Cardiovascular status: blood pressure returned to baseline and stable Postop Assessment: no apparent nausea or vomiting and adequate PO intake Anesthetic complications: no   No notable events documented.  Last Vitals:  Vitals:   09/26/22 1130 09/26/22 1145  BP: 123/87 (!) 145/93  Pulse: 80 78  Resp: 17 15  Temp:    SpO2: 91% 94%    Last Pain:  Vitals:   09/26/22 1145  TempSrc:   PainSc: 4                  Eddie Lopez,E. Alaycia Eardley

## 2022-09-26 NOTE — Op Note (Signed)
Operative Note  Eddie Lopez  [id number]  [id number]  09/26/2022   Surgeon: Phylliss Blakes MD FACS   Assistant: Ivar Drape MD FACS   Procedure performed: Robotic hiatal hernia repair with mesh, toupet fundoplication   Preop diagnosis: Hiatal hernia, GERD, history of eosinophilic esophagitis Post-op diagnosis/intraop findings: same   Specimens: no Retained items: no  EBL: minimal cc Complications: none   Description of procedure: After obtaining informed consent the patient was taken to the operating room and placed supine on operating room table where general endotracheal anesthesia was initiated, preoperative antibiotics were administered, SCDs applied, and a formal timeout was performed.  Foley catheter was inserted which is removed at the end of the case.  The abdomen was prepped and draped in the usual sterile fashion.  Peritoneal access was gained with a left subcostal Veress needle and insufflation to 15 mmHg ensued without incident.  An 8 mm periumbilical robotic trocar was inserted followed by the camera in the abdominal cavity was inspected and confirmed to be free of any injury.  Bilateral laparoscopic assisted tap blocks were performed with quarter percent Marcaine with epinephrine and remaining trocars were inserted under direct visualization along with a subxiphoid liver retractor.  The patient was placed in steep reverse Trendelenburg and the robot was docked, all instruments inserted under direct visualization.  The patient was found to have essentially a sliding hiatal hernia with a patulous hiatus but the GE junction and stomach remained below the diaphragm.  The crura were dissected circumferentially and the esophagus was exposed.  Mediastinal attachments of the esophagus were divided bluntly and with the vessel sealer.  There was quite a bit of fat infiltration along the mediastinum and inflammatory adhesions between the esophagus and mediastinal soft tissues  consistent with longstanding history of reflux and esophagitis.  We were able to mobilize 5 cm of intra-abdominal esophageal length.  The short gastric vessels were divided with the vessel sealer and the fundus was mobilized.  The hiatus was closed with 5 posterior simple interrupted 0 Ethibond's, narrowing this to a diameter of approximately 2.5 cm.  A piece of Gore bio a mesh was trimmed and inserted; this was secured to the right crus, posterior crural repair, and left crus with 3 simple interrupted 0 Ethibond sutures.  The "U" of the mesh is posterior and there is no mesh crossing the anterior hiatus.  The fundus was then brought posteriorly around the esophagus and the shoeshine maneuver performed confirming appropriate orientation.  A posterior 270 degree wrap was then completed with 3 simple interrupted 0 Ethibond's on each side.  On completion the wrap rests in appropriate orientation without any twisting or significant tension on the esophagus and is resting without tension in the abdominal cavity.  The abdominal cavity was inspected and hemostasis ensured.  No injuries were present.   The robotic instruments were removed and the robot was undocked.  The abdomen was then desufflated and the incisions were closed with subcuticular 4 Monocryl and Dermabond.  The patient was then awakened, extubated and taken to PACU in stable condition.    All counts were correct at the completion of the case.

## 2022-09-26 NOTE — Anesthesia Procedure Notes (Signed)
Procedure Name: Intubation Date/Time: 09/26/2022 8:03 AM  Performed by: Deri Fuelling, CRNAPre-anesthesia Checklist: Patient identified, Emergency Drugs available, Suction available and Patient being monitored Patient Re-evaluated:Patient Re-evaluated prior to induction Oxygen Delivery Method: Circle system utilized Preoxygenation: Pre-oxygenation with 100% oxygen Induction Type: IV induction Ventilation: Mask ventilation without difficulty Laryngoscope Size: Glidescope and 4 Grade View: Grade I Tube type: Oral Tube size: 7.5 mm Number of attempts: 1 Airway Equipment and Method: Stylet and Oral airway Placement Confirmation: ETT inserted through vocal cords under direct vision, positive ETCO2 and breath sounds checked- equal and bilateral Secured at: 24 cm Tube secured with: Tape Dental Injury: Teeth and Oropharynx as per pre-operative assessment

## 2022-09-26 NOTE — Transfer of Care (Addendum)
Immediate Anesthesia Transfer of Care Note  Patient: Eddie Lopez  Procedure(s) Performed: ROBOTIC HIATAL HERNIA REPAIR WITH FUNDOPLICATION  Patient Location: PACU  Anesthesia Type:General  Level of Consciousness: awake and alert   Airway & Oxygen Therapy: Patient Spontanous Breathing and Patient connected to face mask oxygen  Post-op Assessment: Report given to RN and Post -op Vital signs reviewed and stable  Post vital signs: Reviewed and stable  Last Vitals:  Vitals Value Taken Time  BP 139/86 09/26/22 1115  Temp 36.5 C 09/26/22 1035  Pulse 80 09/26/22 1130  Resp 17 09/26/22 1130  SpO2 91 % 09/26/22 1130  Vitals shown include unfiled device data.  Last Pain:  Vitals:   09/26/22 1045  TempSrc:   PainSc: 0-No pain      Patients Stated Pain Goal: 3 (09/26/22 0600)  Complications: No notable events documented.

## 2022-09-26 NOTE — Discharge Instructions (Signed)
POST OP INSTRUCTIONS   EAT **See Diet below**  WALK Walk an hour a day (cumulative- not all at once).  Control your pain to do that.    CONTROL PAIN Control pain so that you can walk, sleep, tolerate sneezing/coughing, go up/down stairs.  HAVE A BOWEL MOVEMENT DAILY Keep your bowels regular to avoid problems.  OK to try a laxative to override constipation.  OK to use an antidiarrheal to slow down diarrhea.  Call if not better after 2 tries  CALL IF YOU HAVE PROBLEMS/CONCERNS Call if you are still struggling despite following these instructions. Call if you have concerns not answered by these instructions  Take your usually prescribed home medications unless otherwise directed.  PAIN CONTROL: Pain is best controlled by a usual combination of three different methods TOGETHER: Ice/Heat Over the counter pain medication Prescription pain medication Most patients will experience some swelling and bruising around the incisions.  Ice packs or heating pads (30-60 minutes up to 6 times a day) will help. Use ice for the first few days to help decrease swelling and bruising, then switch to heat to help relax tight/sore spots and speed recovery.  Some people prefer to use ice alone, heat alone, alternating between ice & heat.  Experiment to what works for you.  Swelling and bruising can take several weeks to resolve.   It is helpful to take an over-the-counter pain medication regularly for the first few days: Naproxen (Aleve, etc)  Two 220mg  tabs twice a day OR Ibuprofen (Advil, etc) Three 200mg  tabs four times a day (every meal & bedtime) AND Acetaminophen (Tylenol, etc) 500-650mg  four times a day (every meal & bedtime) A  prescription for pain medication (such as oxycodone, hydrocodone, tramadol, gabapentin, methocarbamol, etc) should be given to you upon discharge.  Take your pain medication as prescribed, IF NEEDED.  If you are having problems/concerns with the prescription medicine (does not  control pain, nausea, vomiting, rash, itching, etc), please call us [phone] to see if we need to switch you to a different pain medicine that will work better for you and/or control your side effect better. If you need a refill on your pain medication, please give [place] 48 hour notice.  contact your pharmacy.  They will contact our office to request authorization. Prescriptions will not be filled after 5 pm or on week-ends  Avoid getting constipated.   Between the surgery and the pain medications, it is common to experience some constipation.   Increasing fluid intake and taking a fiber supplement (such as Metamucil, Citrucel, FiberCon, MiraLax, etc) 1-2 times a day regularly will usually help prevent this problem from occurring.   A mild laxative (prune juice, Milk of Magnesia, MiraLax, etc) should be taken according to package directions if there are no bowel movements after 48 hours.   Watch out for diarrhea.   If you have many loose bowel movements, simplify your diet to bland foods & liquids for a few days.   Stop any stool softeners and decrease your fiber supplement.   Switching to mild anti-diarrheal medications (Kayopectate, Pepto Bismol) can help.   If this worsens or does not improve, please call us.  Wash / shower every day.  You may shower over the skin glue which is waterproof.  Do not soak or submerge incisions.  No rubbing, scrubbing, lotions or ointments to incisions.  Glue will flake off after about 2 weeks.  You may leave the incision open to air.  You may replace  a dressing/Band-Aid to cover the incision for comfort if you wish.   ACTIVITIES as tolerated:   You may resume regular (light) daily activities beginning the next day--such as daily self-care, walking, climbing stairs--gradually increasing activities as tolerated.  If you can walk 30 minutes without difficulty, it is safe to try more intense activity such as jogging, treadmill, bicycling, low-impact aerobics,  swimming, etc. Refrain from the most intensive and strenuous activity such as sit-ups, heavy lifting (>20lb), contact sports, etc. Avoid pushing, straining bearing down, retching/gagging, and forceful coughing as much as possible.   Refrain from any heavy lifting or straining until at least 6 weeks post-op.   DO NOT PUSH THROUGH PAIN.  Let pain be your guide: If it hurts to do something, don't do it.  Pain is your body warning you to avoid that activity for another week until the pain goes down. You may drive when you are no longer taking prescription pain medication, you can comfortably wear a seatbelt, and you can safely maneuver your car and apply brakes. You may have sexual intercourse when it is comfortable.  FOLLOW UP in our office Please call [facility] at [phone] to set up an appointment to see your surgeon in the office for a follow-up appointment approximately 2-3 weeks after your surgery. Make sure that you call for this appointment the day you arrive home to insure a convenient appointment time.  10. IF YOU HAVE DISABILITY OR FAMILY LEAVE FORMS, BRING THEM TO THE OFFICE FOR PROCESSING.  DO NOT GIVE THEM TO YOUR DOCTOR.   WHEN TO CALL us [phone]: Poor pain control Reactions / problems with new medications (rash/itching, nausea, etc)  Fever over 101.5 F (38.5 C) Inability to urinate Nausea and/or vomiting Worsening swelling or bruising Continued bleeding from incision. Increased pain, redness, or drainage from the incision   The clinic staff is available to answer your questions during regular business hours (8:30am-5pm).  Please don't hesitate to call and ask to speak to one of our nurses for clinical concerns.   If you have a medical emergency, go to the nearest emergency room or call 911.  A surgeon from [facility] is always on call at the [facility], [place] [address], [address], [place], [place]  [address] ? MAIN: [phone]  [phone] ? TOLL FREE: [phone] ?  FAX [phone] [email or web address]   EATING AFTER YOUR PARAESOPHAGEAL HERNIA REPAIR SURGERY  After your esophageal surgery, expect some sticking with swallowing over the next 1-2 months.    If food sticks when you eat, it is called "dysphagia".  This is due to swelling around your esophagus at the wrap & hiatal diaphragm repair.  It will gradually ease off over the next few months.  To help you through this temporary phase, we start you out on a pureed (blenderized) diet.  Your first meal in the hospital was thin liquids.  You should have been given a pureed diet by the time you left the hospital.  We ask patients to stay on a pureed diet for the first 2-3 weeks to avoid anything getting "stuck" near your recent surgery.  Don't be alarmed if your ability to swallow doesn't progress according to this plan.  Everyone is different and some diets can advance more or less quickly.    It is often helpful to crush your medications or split them as they can sometimes stick, especially the first week or so.   Some BASIC  RULES to follow are: Maintain an upright position whenever eating or drinking. Take small bites - just a teaspoon size bite at a time. Eat slowly.  It may also help to eat only one food at a time. Consider nibbling through smaller, more frequent meals & avoid the urge to eat BIG meals Do not push through feelings of fullness, nausea, or bloatedness Do not mix solid foods and liquids in the same mouthful Try not to "wash foods down" with large gulps of liquids. Avoid carbonated (bubbly/fizzy) drinks.   Avoid foods that make you feel gassy or bloated.  Start with bland foods first.  Wait on trying greasy, fried, or spicy meals until you are tolerating more bland solids well. Understand that it will be hard to burp and belch at first.  This gradually improves with time.  Expect to be more gassy/flatulent/bloated initially.  Walking  will help your body manage it better. Consider using medications for bloating that contain simethicone such as  Maalox or Gas-X  Consider crushing her medications, especially smaller pills.  The ability to swallow pills should get easier after a few weeks Eat in a relaxed atmosphere & minimize distractions. Avoid talking while eating.   Do not use straws. Following each meal, sit in an upright position (90 degree angle) for 60 to 90 minutes.  Going for a short walk can help as well If food does stick, don't panic.  Try to relax and let the food pass on its own.  Sipping WARM LIQUID such as strong hot black tea can also help slide it down.   Be gradual in changes & use common sense:  -If you easily tolerating a certain "level" of foods, advance to the next level gradually -If you are having trouble swallowing a particular food, then avoid it.   -If food is sticking when you advance your diet, go back to thinner previous diet (the lower LEVEL) for 1-2 days.  LEVEL 1 = PUREED DIET  Do for the first 2 WEEKS AFTER SURGERY  -Foods in this group are pureed or blenderized to a smooth, mashed potato-like consistency.  -If necessary, the pureed foods can keep their shape with the addition of a thickening agent.   -Meat should be pureed to a smooth, pasty consistency.  Hot broth or gravy may be added to the pureed meat, approximately 1 oz. of liquid per 3 oz. serving of meat. -CAUTION:  If any foods do not puree into a smooth consistency, swallowing will be more difficult.  (For example, nuts or seeds sometimes do not blend well.)  Hot Foods Cold Foods  Pureed scrambled eggs and cheese Pureed cottage cheese  Baby cereals Thickened juices and nectars  Thinned cooked cereals (no lumps) Thickened milk or eggnog  Pureed [place] toast or pancakes Ensure  Mashed potatoes Ice cream  Pureed parsley, au gratin, scalloped potatoes, candied sweet potatoes Fruit or [place] ice, sherbet  Pureed buttered or  alfredo noodles Plain yogurt  Pureed vegetables (no corn or peas) Instant breakfast  Pureed soups and creamed soups Smooth pudding, mousse, custard  Pureed scalloped apples Whipped gelatin  Gravies Sugar, syrup, honey, jelly  Sauces, cheese, tomato, barbecue, white, creamed Cream  Any baby food Creamer  Alcohol in moderation (not beer or champagne) Margarine  Coffee or tea Mayonnaise   Ketchup, mustard   Apple sauce   SAMPLE MENU:  PUREED DIET Breakfast Lunch Dinner  Orange juice, 1/2 cup Cream of wheat, 1/2 cup Pineapple juice, 1/2 cup Pureed  [place], barley soup, 3/4 cup Pureed Hawaiian chicken, 3 oz  Scrambled eggs, mashed or blended with cheese, 1/2 cup Tea or coffee, 1 cup  Whole milk, 1 cup  Non-dairy creamer, 2 Tbsp. Mashed potatoes, 1/2 cup Pureed cooled broccoli, 1/2 cup Apple sauce, 1/2 cup Coffee or tea Mashed potatoes, 1/2 cup Pureed spinach, 1/2 cup Frozen yogurt, 1/2 cup Tea or coffee      LEVEL 2 = SOFT DIET  After your first 2 weeks, you can advance to a soft diet.   Keep on this diet until everything goes down easily.  Hot Foods Cold Foods  White fish Cottage cheese  Stuffed fish Junior baby fruit  Baby food meals Semi thickened juices  Minced soft cooked, scrambled, poached eggs nectars  Souffle & omelets Ripe mashed bananas  Cooked cereals Canned fruit, pineapple sauce, milk  potatoes Milkshake  Buttered or Alfredo noodles Custard  Cooked cooled vegetable Puddings, including tapioca  Sherbet Yogurt  Vegetable soup or alphabet soup Fruit ice, [place] ice  Gravies Whipped gelatin  Sugar, syrup, honey, jelly Junior baby desserts  Sauces:  Cheese, creamed, barbecue, tomato, white Cream  Coffee or tea Margarine   SAMPLE MENU:  LEVEL 2 Breakfast Lunch Dinner  Orange juice, 1/2 cup Oatmeal, 1/2 cup Scrambled eggs with cheese, 1/2 cup Decaffeinated tea, 1 cup Whole milk, 1 cup Non-dairy creamer, 2 Tbsp Pineapple juice, 1/2 cup Minced beef, 3  oz Gravy, 2 Tbsp Mashed potatoes, 1/2 cup Minced fresh broccoli, 1/2 cup Applesauce, 1/2 cup Coffee, 1 cup [place], barley soup, 3/4 cup Minced Hawaiian chicken, 3 oz Mashed potatoes, 1/2 cup Cooked spinach, 1/2 cup Frozen yogurt, 1/2 cup Non-dairy creamer, 2 Tbsp      LEVEL 3 = CHOPPED DIET  -After all the foods in level 2 (soft diet) are passing through well you should advance up to more chopped foods.  -It is still important to cut these foods into small pieces and eat slowly.  Hot Foods Cold Foods  Poultry Cottage cheese  Chopped Swedish meatballs Yogurt  Meat salads (ground or flaked meat) Milk  Flaked fish (tuna) Milkshakes  Poached or scrambled eggs Soft, cold, dry cereal  Souffles and omelets Fruit juices or nectars  Cooked cereals Chopped canned fruit  Chopped [place] toast or pancakes Canned fruit cocktail  Noodles or pasta (no rice) Pudding, mousse, custard  Cooked vegetables (no frozen peas, corn, or mixed vegetables) Green salad  Canned small sweet peas Ice cream  Creamed soup or vegetable soup Fruit ice, [place] ice  Pureed vegetable soup or alphabet soup Non-dairy creamer  Ground scalloped apples Margarine  Gravies Mayonnaise  Sauces:  Cheese, creamed, barbecue, tomato, white Ketchup  Coffee or tea Mustard   SAMPLE MENU:  LEVEL 3 Breakfast Lunch Dinner  Orange juice, 1/2 cup Oatmeal, 1/2 cup Scrambled eggs with cheese, 1/2 cup Decaffeinated tea, 1 cup Whole milk, 1 cup Non-dairy creamer, 2 Tbsp Ketchup, 1 Tbsp Margarine, 1 tsp Salt, 1/4 tsp Sugar, 2 tsp Pineapple juice, 1/2 cup Ground beef, 3 oz Gravy, 2 Tbsp Mashed potatoes, 1/2 cup Cooked spinach, 1/2 cup Applesauce, 1/2 cup Decaffeinated coffee Whole milk Non-dairy creamer, 2 Tbsp Margarine, 1 tsp Salt, 1/4 tsp Pureed [place], barley soup, 3/4 cup Barbecue chicken, 3 oz Mashed potatoes, 1/2 cup Ground fresh broccoli, 1/2 cup Frozen yogurt, 1/2 cup Decaffeinated tea, 1 cup Non-dairy  creamer, 2 Tbsp Margarine, 1 tsp Salt, 1/4 tsp Sugar, 1 tsp    LEVEL 4:  REGULAR FOODS  -Foods  in this group are soft, moist, regularly textured foods.   -This level includes meat and breads, which tend to be the hardest things to swallow.   -Eat very slowly, chew well and continue to avoid carbonated drinks. -most people are at this level in 4-6 weeks  Hot Foods Cold Foods  Baked fish or skinned Soft cheeses - cottage cheese  Souffles and omelets Cream cheese  Eggs Yogurt  Stuffed shells Milk  Spaghetti with meat sauce Milkshakes  Cooked cereal Cold dry cereals (no nuts, dried fruit, coconut)  [place] toast or pancakes Crackers  Buttered toast Fruit juices or nectars  Noodles or pasta (no rice) Canned fruit  Potatoes (all types) Ripe bananas  Soft, cooked vegetables (no corn, lima, or baked beans) Peeled, ripe, fresh fruit  Creamed soups or vegetable soup Cakes (no nuts, dried fruit, coconut)  Canned chicken noodle soup Plain doughnuts  Gravies Ice cream  Bacon dressing Pudding, mousse, custard  Sauces:  Cheese, creamed, barbecue, tomato, white Fruit ice, [place] ice, sherbet  Decaffeinated tea or coffee Whipped gelatin  Pork chops Regular gelatin   Canned fruited gelatin molds   Sugar, syrup, honey, jam, jelly   Cream   Non-dairy   Margarine   Oil   Mayonnaise   Ketchup   Mustard   TROUBLESHOOTING IRREGULAR BOWELS  1) Avoid extremes of bowel movements (no bad constipation/diarrhea)  2) Miralax 17gm mixed in 8oz. water or juice-daily. May use BID as needed.  3) Gas-x,Phazyme, etc. as needed for gas & bloating.  4) Soft,bland diet. No spicy,greasy,fried foods.  5) Prilosec over-the-counter as needed  6) May hold gluten/wheat products from diet to see if symptoms improve.  7) May try probiotics (Align, Activa, etc) to help calm the bowels down  7) If symptoms become worse call back immediately.    If you have any questions please call our office at [facility]  [facility]: [phone].

## 2022-09-26 NOTE — Interval H&P Note (Signed)
History and Physical Interval Note:  09/26/2022 6:59 AM  Eddie Lopez  has presented today for surgery, with the diagnosis of HIATAL HERNIA WITH REFLUX.  The various methods of treatment have been discussed with the patient and family. After consideration of risks, benefits and other options for treatment, the patient has consented to  Procedure(s): ROBOTIC HIATAL HERNIA REPAIR WITH FUNDOPLICATION (N/A) as a surgical intervention.  The patient's history has been reviewed, patient examined, no change in status, stable for surgery.  I have reviewed the patient's chart and labs.  Questions were answered to the patient's satisfaction.     Verner Kopischke Lollie Sails

## 2022-09-27 ENCOUNTER — Encounter (HOSPITAL_COMMUNITY): Payer: Self-pay | Admitting: Surgery

## 2022-09-27 ENCOUNTER — Ambulatory Visit (HOSPITAL_COMMUNITY): Payer: Managed Care, Other (non HMO)

## 2022-09-27 DIAGNOSIS — K449 Diaphragmatic hernia without obstruction or gangrene: Secondary | ICD-10-CM | POA: Diagnosis not present

## 2022-09-27 LAB — HEMOGLOBIN AND HEMATOCRIT, BLOOD
HCT: 43.2 % (ref 39.0–52.0)
Hemoglobin: 15 g/dL (ref 13.0–17.0)

## 2022-09-27 LAB — CBC
HCT: 38 % — ABNORMAL LOW (ref 39.0–52.0)
Hemoglobin: 12.8 g/dL — ABNORMAL LOW (ref 13.0–17.0)
MCH: 28.9 pg (ref 26.0–34.0)
MCHC: 33.7 g/dL (ref 30.0–36.0)
MCV: 85.8 fL (ref 80.0–100.0)
Platelets: 143 10*3/uL — ABNORMAL LOW (ref 150–400)
RBC: 4.43 MIL/uL (ref 4.22–5.81)
RDW: 13.2 % (ref 11.5–15.5)
WBC: 7.8 10*3/uL (ref 4.0–10.5)
nRBC: 0 % (ref 0.0–0.2)

## 2022-09-27 LAB — BASIC METABOLIC PANEL
Anion gap: 6 (ref 5–15)
BUN: 11 mg/dL (ref 6–20)
CO2: 23 mmol/L (ref 22–32)
Calcium: 8.1 mg/dL — ABNORMAL LOW (ref 8.9–10.3)
Chloride: 106 mmol/L (ref 98–111)
Creatinine, Ser: 0.79 mg/dL (ref 0.61–1.24)
GFR, Estimated: >60 mL/min (ref >60)
Glucose, Bld: 102 mg/dL — ABNORMAL HIGH (ref 70–99)
Potassium: 3.2 mmol/L — ABNORMAL LOW (ref 3.5–5.1)
Sodium: 135 mmol/L (ref 135–145)

## 2022-09-27 LAB — MAGNESIUM: Magnesium: 1.8 mg/dL (ref 1.7–2.4)

## 2022-09-27 MED ORDER — DOCUSATE SODIUM 100 MG PO CAPS: 100.0000 mg | ORAL_CAPSULE | Freq: Two times a day (BID) | ORAL | 0 refills | Status: DC

## 2022-09-27 MED ORDER — MAGNESIUM OXIDE -MG SUPPLEMENT 400 (240 MG) MG PO TABS
400.0000 mg | ORAL_TABLET | Freq: Every day | ORAL | Status: DC
  Administered 2022-09-27: 400 mg via ORAL
  Filled 2022-09-27: qty 1

## 2022-09-27 MED ORDER — ONDANSETRON 4 MG PO TBDP: 4.0000 mg | ORAL_TABLET | Freq: Three times a day (TID) | ORAL | 0 refills | Status: DC | PRN

## 2022-09-27 MED ORDER — TRAMADOL HCL 50 MG PO TABS: 50.0000 mg | ORAL_TABLET | Freq: Four times a day (QID) | ORAL | 0 refills | Status: AC | PRN

## 2022-09-27 MED ORDER — IOHEXOL 300 MG/ML  SOLN
50.0000 mL | Freq: Once | INTRAMUSCULAR | Status: AC | PRN
  Administered 2022-09-27: 50 mL via ORAL

## 2022-09-27 MED ORDER — POTASSIUM CHLORIDE 20 MEQ PO PACK
60.0000 meq | PACK | Freq: Once | ORAL | Status: AC
  Administered 2022-09-27: 60 meq via ORAL
  Filled 2022-09-27: qty 3

## 2022-09-27 NOTE — Plan of Care (Signed)
Patient is stable for discharge. Discharge instructions have been given. All questions were answered, patient is discharged to home with spouse.  

## 2022-09-27 NOTE — Progress Notes (Signed)
   09/27/22 0841  TOC Brief Assessment  Insurance and Status Reviewed  Patient has primary care physician Yes  Home environment has been reviewed Resides with spouse  Prior level of function: Independent at baseline  Prior/Current Home Services No current home services  Social Determinants of Health Reivew SDOH reviewed no interventions necessary  Readmission risk has been reviewed Yes  Transition of care needs no transition of care needs at this time

## 2022-09-27 NOTE — Progress Notes (Signed)
S: Feeling well. Uneventful night. No nausea, some pain in his shoulders when he is up moving around. Tolerating liquids without issue.   O: Vitals, labs, intake/output, and orders reviewed at this time. Afebrile, no tachycardia, normotensive. PO 780, UOP 2150. K low at 3.2, hgb 12.8 (15.3 preop), WBC 7.8, plt 143 (161)   Gen: A&Ox3, no distress  Chest: unlabored respirations, RRR Abd: soft, nontender, nondistended, incision(s) c/d/i with dermabond, no cellulitis or hematoma Ext: warm, no edema Neuro: grossly normal  Lines/tubes/drains: PIV  A/P: POD 1 s/p robotic HHR w toupet fundo, doing well  -FLD -UGI -Hypookalemia 3.2- replace PO -Hemoglobin decrease- no clinical signs of bleeding and not much intra-op either. Pt notes labs were drawn upstream from his IV, likely some dilutional component. Will recheck HGB  Anticipate DC home this afternoon  Phylliss Blakes, MD [facility], [place]

## 2022-10-04 ENCOUNTER — Encounter: Payer: Self-pay | Admitting: Gastroenterology

## 2022-10-04 ENCOUNTER — Encounter (INDEPENDENT_AMBULATORY_CARE_PROVIDER_SITE_OTHER): Payer: Self-pay

## 2022-10-24 ENCOUNTER — Ambulatory Visit (INDEPENDENT_AMBULATORY_CARE_PROVIDER_SITE_OTHER): Payer: Managed Care, Other (non HMO) | Admitting: Family Medicine

## 2022-10-24 ENCOUNTER — Encounter: Payer: Self-pay | Admitting: Family Medicine

## 2022-10-24 VITALS — BP 116/70 | HR 53 | Temp 97.7°F | Ht 73.0 in | Wt 254.8 lb

## 2022-10-24 DIAGNOSIS — Z0001 Encounter for general adult medical examination with abnormal findings: Secondary | ICD-10-CM

## 2022-10-24 DIAGNOSIS — E669 Obesity, unspecified: Secondary | ICD-10-CM | POA: Diagnosis not present

## 2022-10-24 DIAGNOSIS — D229 Melanocytic nevi, unspecified: Secondary | ICD-10-CM

## 2022-10-24 DIAGNOSIS — Z13220 Encounter for screening for lipoid disorders: Secondary | ICD-10-CM

## 2022-10-24 DIAGNOSIS — Z125 Encounter for screening for malignant neoplasm of prostate: Secondary | ICD-10-CM | POA: Diagnosis not present

## 2022-10-24 LAB — LIPID PANEL
Cholesterol: 189 mg/dL (ref 0–200)
HDL: 35.2 mg/dL — ABNORMAL LOW (ref >39.00)
LDL Cholesterol: 126 mg/dL — ABNORMAL HIGH (ref 0–99)
NonHDL: 153.82
Total CHOL/HDL Ratio: 5
Triglycerides: 141 mg/dL (ref 0.0–149.0)
VLDL: 28.2 mg/dL (ref 0.0–40.0)

## 2022-10-24 LAB — COMPREHENSIVE METABOLIC PANEL
ALT: 25 U/L (ref 0–53)
AST: 18 U/L (ref 0–37)
Albumin: 4.5 g/dL (ref 3.5–5.2)
Alkaline Phosphatase: 86 U/L (ref 39–117)
BUN: 10 mg/dL (ref 6–23)
CO2: 28 mEq/L (ref 19–32)
Calcium: 9.6 mg/dL (ref 8.4–10.5)
Chloride: 102 mEq/L (ref 96–112)
Creatinine, Ser: 0.86 mg/dL (ref 0.40–1.50)
GFR: 106.18 mL/min (ref >60.00)
Glucose, Bld: 96 mg/dL (ref 70–99)
Potassium: 4 mEq/L (ref 3.5–5.1)
Sodium: 138 mEq/L (ref 135–145)
Total Bilirubin: 2 mg/dL — ABNORMAL HIGH (ref 0.2–1.2)
Total Protein: 7.2 g/dL (ref 6.0–8.3)

## 2022-10-24 LAB — HEMOGLOBIN A1C: Hgb A1c MFr Bld: 5.2 % (ref 4.6–6.5)

## 2022-10-24 LAB — PSA: PSA: 1.22 ng/mL (ref 0.10–4.00)

## 2022-10-24 NOTE — Progress Notes (Signed)
Marikay Alar, MD Phone: [phone]  Eddie Lopez is a 42 y.o. male who presents today for CPE.  Diet: patient just had a fundoplication and this is contributing to a different diet than typical.  He has progressively been advancing his diet. Exercise: Some walking though has not been able to lift more than 20 pounds given his recent fundoplication. 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  Recently there has been a raised portion that is coming up that is different.  He notes the shape of the overall nevus is not any different.  He saw a dermatologist a number of years ago.   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   Obesity (BMI 35.0-39.9 without comorbidity) 10/13/2019   Eosinophilic esophagitis    Polyp of colon    Heartburn    Hx of adenomatous colonic polyps 01/13/2022   Hiatal hernia 01/13/2022   Palpitations 05/24/2022   S/P repair of paraesophageal hernia 09/26/2022   Resolved Ambulatory Problems    Diagnosis Date Noted   PVC (premature ventricular contraction) 12/20/2010   Stomach irritation    Diarrhea    Pes anserinus bursitis of left knee 04/19/2020   Past Medical History:  Diagnosis Date   History of hiatal hernia    Hypertension     PVC's (premature ventricular contractions)    Vitamin D deficiency     Family History  Problem Relation Age of Onset   Hyperlipidemia Mother    Prostate cancer Father    Heart disease Maternal Grandmother    Throat cancer Maternal Grandfather    Leukemia Paternal Grandmother    Heart disease Paternal Grandfather    Colon cancer Neg Hx    Esophageal cancer Neg Hx    Inflammatory bowel disease Neg Hx    Liver disease Neg Hx    Pancreatic cancer Neg Hx    Rectal cancer Neg Hx    Stomach cancer Neg Hx     Social History   Socioeconomic History   Marital status: Married    Spouse name: Not on file   Number of children: 2   Years of education: Not on file   Highest education level: Not on file  Occupational History   Occupation: EMS director/paramedic  Tobacco Use   Smoking status: Never   Smokeless tobacco: Never  Vaping Use   Vaping status: Never Used  Substance and Sexual Activity   Alcohol use: Yes    Alcohol/week: 0.0 standard drinks of alcohol    Comment: 1 beer a week at most weekly   Drug use: No   Sexual activity: Yes  Other Topics Concern   Not on file  Social History Narrative   Not on file   Social Determinants of Health   Financial Resource Strain: Not on file  Food Insecurity: Not on  file  Transportation Needs: Not on file  Physical Activity: Not on file  Stress: Not on file  Social Connections: Not on file  Intimate Partner Violence: Not on file    ROS  General:  Negative for nexplained weight loss, fever Skin: Negative for new or changing mole, sore that won't heal HEENT: Negative for trouble hearing, trouble seeing, ringing in ears, mouth sores, hoarseness, change in voice, dysphagia. CV:  Negative for chest pain, dyspnea, edema, palpitations Resp: Negative for cough, dyspnea, hemoptysis GI: Negative for nausea, vomiting, diarrhea, constipation, abdominal pain, melena, hematochezia. GU: Negative for dysuria, incontinence, urinary  hesitance, hematuria, vaginal or penile discharge, polyuria, sexual difficulty, lumps in testicle or breasts MSK: Negative for muscle cramps or aches, joint pain or swelling Neuro: Negative for headaches, weakness, numbness, dizziness, passing out/fainting Psych: Negative for depression, anxiety, memory problems  Objective  Physical Exam Vitals:   10/24/22 0929  BP: 116/70  Pulse: (!) 53  Temp: 97.7 F (36.5 C)  SpO2: 98%    BP Readings from Last 3 Encounters:  10/24/22 116/70  09/27/22 118/75  09/08/22 (!) 131/93   Wt Readings from Last 3 Encounters:  10/24/22 254 lb 12.8 oz (115.6 kg)  09/26/22 267 lb (121.1 kg)  09/08/22 267 lb (121.1 kg)    Physical Exam Constitutional:      General: He is not in acute distress.    Appearance: He is not diaphoretic.  HENT:     Head: Normocephalic and atraumatic.  Cardiovascular:     Rate and Rhythm: Normal rate and regular rhythm.     Heart sounds: Normal heart sounds.  Pulmonary:     Effort: Pulmonary effort is normal.     Breath sounds: Normal breath sounds.  Abdominal:     General: Bowel sounds are normal. There is no distension.     Palpations: Abdomen is soft.     Tenderness: There is no abdominal tenderness.  Musculoskeletal:     Right lower leg: No edema.     Left lower leg: No edema.  Lymphadenopathy:     Cervical: No cervical adenopathy.  Skin:    General: Skin is warm and dry.  Neurological:     Mental Status: He is alert.  Psychiatric:        Mood and Affect: Mood normal.     Right forearm  Assessment/Plan:   Encounter for general adult medical examination with abnormal findings Assessment & Plan: Physical exam completed.  Encouraged healthy diet and exercise once he is released to do so after his surgery.  PSA to be collected today given family history of prostate cancer.  Patient will get his flu vaccine through work.  I have encouraged him to let us know when he gets this.  He declines further COVID  vaccinations.  Lab work as outlined.   Nevus Assessment & Plan: Changing nevus.  Refer to dermatology for evaluation.  Note sent to dermatology asking them to review the picture to determine how quickly patient needs to be seen.  Orders: -     Ambulatory referral to Dermatology  Obesity (BMI 35.0-39.9 without comorbidity) -     Hemoglobin A1c  Prostate cancer screening -     PSA  Lipid screening -     Comprehensive metabolic panel -     Lipid panel  Patient was advised to contact us if he has not heard from dermatology for [place] within a week on his dermatology appointment.  Return in about 1 year (around  10/24/2023) for physical.   Marikay Alar, MD [facility]

## 2022-10-24 NOTE — Patient Instructions (Signed)
Dermatology should contact you to schedule an appointment.  If you do not hear from them in the next week please let us know.

## 2022-10-24 NOTE — Assessment & Plan Note (Addendum)
Changing nevus.  Refer to dermatology for evaluation.  Note sent to dermatology asking them to review the picture to determine how quickly patient needs to be seen.

## 2022-10-24 NOTE — Assessment & Plan Note (Signed)
Physical exam completed.  Encouraged healthy diet and exercise once he is released to do so after his surgery.  PSA to be collected today given family history of prostate cancer.  Patient will get his flu vaccine through work.  I have encouraged him to let us know when he gets this.  He declines further COVID vaccinations.  Lab work as outlined.

## 2022-10-25 ENCOUNTER — Ambulatory Visit: Payer: Managed Care, Other (non HMO) | Admitting: Dermatology

## 2022-10-25 ENCOUNTER — Encounter: Payer: Self-pay | Admitting: Dermatology

## 2022-10-25 VITALS — BP 118/77

## 2022-10-25 DIAGNOSIS — D2371 Other benign neoplasm of skin of right lower limb, including hip: Secondary | ICD-10-CM | POA: Diagnosis not present

## 2022-10-25 DIAGNOSIS — D2261 Melanocytic nevi of right upper limb, including shoulder: Secondary | ICD-10-CM | POA: Diagnosis not present

## 2022-10-25 DIAGNOSIS — L689 Hypertrichosis, unspecified: Secondary | ICD-10-CM

## 2022-10-25 DIAGNOSIS — D492 Neoplasm of unspecified behavior of bone, soft tissue, and skin: Secondary | ICD-10-CM

## 2022-10-25 DIAGNOSIS — D239 Other benign neoplasm of skin, unspecified: Secondary | ICD-10-CM

## 2022-10-25 HISTORY — DX: Other benign neoplasm of skin, unspecified: D23.9

## 2022-10-25 NOTE — Progress Notes (Signed)
   New Patient Visit   Subjective  Eddie Lopez is a 42 y.o. male who presents for the following: check mole R arm, has had mole for yrs, but has recently gotten a raised are within, no symptoms, no hx of skin cancer, no fhx of skin cancer The patient has spots, moles and lesions to be evaluated, some may be new or changing and the patient may have concern these could be cancer.  New patient referral from Dr. Marikay Alar.  The following portions of the chart were reviewed this encounter and updated as appropriate: medications, allergies, medical history  Review of Systems:  No other skin or systemic complaints except as noted in HPI or Assessment and Plan.  Objective  Well appearing patient in no apparent distress; mood and affect are within normal limits.   A focused examination was performed of the following areas: Right arm, back, legs  Relevant exam findings are noted in the Assessment and Plan.  R lat forearm 1.5cm hyperpigmented patch with hypertrichosis, within 3.68mm pigmented pink pap         Assessment & Plan   Neoplasm of skin R lat forearm  Skin / nail biopsy Type of biopsy: tangential   Informed consent: discussed and consent obtained   Anesthesia: the lesion was anesthetized in a standard fashion   Anesthesia comment:  Area prepped with alcohol Anesthetic:  1% lidocaine w/ epinephrine 1-100,000 buffered w/ 8.4% NaHCO3 Instrument used: DermaBlade   Hemostasis achieved with: pressure and aluminum chloride   Outcome: patient tolerated procedure well   Post-procedure details: wound care instructions given   Post-procedure details comment:  Ointment and small bandage applied  Specimen 1 - Surgical pathology Differential Diagnosis: congenital melanocytic nevus with D48.5 Proliferative Melanocytic Nodule vs Melanoma  Check Margins: No 1.5cm hyperpigmented patch with hypertrichosis, within 8.0 mm and 3.80mm pigmented pink papules  - discussed congenital  nevi are benign. Risk of melanoma forming within them, but this is rare. It is especially rare on a small congenital nevus such as the patient's. More likely to be a benign proliferative nodule. Offered monitoring for changes vs biopsy today. Jointly decided on biopsy for confirmation  DERMATOFIBROMA R lower leg Exam: Firm pink/brown papulenodule with dimple sign. Treatment Plan: A dermatofibroma is a benign growth possibly related to trauma, such as an insect bite, cut from shaving, or inflamed acne-type bump.  Treatment options to remove include shave or excision with resulting scar and risk of recurrence.  Since benign-appearing and not bothersome, will observe for now.    Return if symptoms worsen or fail to improve.  I, Ardis Rowan, RMA, am acting as scribe for Elie Goody, MD .   Documentation: I have reviewed the above documentation for accuracy and completeness, and I agree with the above.  Elie Goody, MD

## 2022-10-25 NOTE — Patient Instructions (Addendum)

## 2022-11-01 ENCOUNTER — Telehealth: Payer: Self-pay

## 2022-11-01 NOTE — Telephone Encounter (Signed)
-----   Message from [place] sent at 10/31/2022 10:03 PM EDT ----- Diagnosis: Skin , R lat forearm ATYPICAL COMBINED NEVUS, DEEP MARGIN INVOLVED, SEE DESCRIPTION.  Please call to share diagnosis and discuss excision  Explanation: The growths in your mole are most likely benign, but they were atypical enough that the pathologist recommended removal to be safe. We should remove the entire mole as below.  Excision - you return for an hour long appointment in our clinic where we perform a skin surgery. We numb the site and a safety margin of normal skin around it. We remove the full thickness of skin and close the wound with two layers of stitches. The sample is sent to the lab to check that the mole was fully removed. Return one week later to have wound checked and surface stitches removed. Surgical wound leaves a line scar. Risk of recurrence, bleeding, infection, pain, injury to nearby structures, hypertrophic scar.

## 2022-11-01 NOTE — Telephone Encounter (Signed)
Patient advised pathology showed atypical nevus, scheduled for excision 11/15/22 at 8:30 am. Butch Penny., RMA

## 2022-11-15 ENCOUNTER — Encounter: Payer: Managed Care, Other (non HMO) | Admitting: Dermatology

## 2022-11-23 ENCOUNTER — Other Ambulatory Visit: Payer: Self-pay | Admitting: Family Medicine

## 2022-11-23 DIAGNOSIS — R002 Palpitations: Secondary | ICD-10-CM

## 2022-12-06 ENCOUNTER — Encounter: Payer: Self-pay | Admitting: Dermatology

## 2022-12-06 ENCOUNTER — Telehealth: Payer: Self-pay

## 2022-12-06 ENCOUNTER — Ambulatory Visit: Payer: Managed Care, Other (non HMO) | Admitting: Dermatology

## 2022-12-06 VITALS — BP 146/69 | HR 51

## 2022-12-06 DIAGNOSIS — D2261 Melanocytic nevi of right upper limb, including shoulder: Secondary | ICD-10-CM | POA: Diagnosis not present

## 2022-12-06 DIAGNOSIS — D229 Melanocytic nevi, unspecified: Secondary | ICD-10-CM

## 2022-12-06 MED ORDER — MUPIROCIN 2 % EX OINT: 1.0000 | TOPICAL_OINTMENT | Freq: Every day | CUTANEOUS | 0 refills | Status: AC

## 2022-12-06 NOTE — Progress Notes (Signed)
   Follow-Up Visit   Subjective  Eddie Lopez is a 42 y.o. male who presents for the following: Excision of bx proven atypical combined nevus at right lat forearm  The following portions of the chart were reviewed this encounter and updated as appropriate: medications, allergies, medical history  Review of Systems:  No other skin or systemic complaints except as noted in HPI or Assessment and Plan.  Objective  Well appearing patient in no apparent distress; mood and affect are within normal limits.  A focused examination was performed of the following areas: Right arm Relevant physical exam findings are noted in the Assessment and Plan.   Right Lateral Forearm 1.5cm hyperpigmented patch with hypertrichosis, within 3.69mm pigmented pink pap     Assessment & Plan   Atypical nevus Right Lateral Forearm  Skin excision - Right Lateral Forearm  Lesion length (cm):  1.5 Lesion width (cm):  1.5 Margin per side (cm):  0.2 Total excision diameter (cm):  1.9 Informed consent: discussed and consent obtained   Timeout: patient name, date of birth, surgical site, and procedure verified   Procedure prep:  Patient was prepped and draped in usual sterile fashion Prep type:  Chlorhexidine Anesthesia: the lesion was anesthetized in a standard fashion   Anesthetic:  1% lidocaine w/ epinephrine 1-100,000 buffered w/ 8.4% NaHCO3 (6 cc lido w/epi, 6 cc bupivicaine) Instrument used: #15 blade   Hemostasis achieved with: pressure   Outcome: patient tolerated procedure well with no complications   Additional details:  Tagged lateral  Skin repair - Right Lateral Forearm Complexity:  Intermediate Final length (cm):  7 Informed consent: discussed and consent obtained   Timeout: patient name, date of birth, surgical site, and procedure verified   Procedure prep:  Patient was prepped and draped in usual sterile fashion Prep type:  Chlorhexidine Anesthesia: the lesion was anesthetized in a  standard fashion   Anesthetic:  1% lidocaine w/ epinephrine 1-100,000 buffered w/ 8.4% NaHCO3 Reason for type of repair: reduce tension to allow closure, reduce the risk of dehiscence, infection, and necrosis, reduce subcutaneous dead space and avoid a hematoma, allow closure of the large defect and preserve normal anatomy   Undermining: edges could be approximated without difficulty   Subcutaneous layers (deep stitches):  Suture size:  4-0 Suture type: Monocryl (poliglecaprone 25)   Stitches:  Buried vertical mattress Fine/surface layer approximation (top stitches):  Suture size:  5-0 Suture type: Prolene (polypropylene)   Stitches: simple running   Suture removal (days):  7 Hemostasis achieved with: suture, pressure and electrodesiccation Outcome: patient tolerated procedure well with no complications   Post-procedure details: sterile dressing applied and wound care instructions given   Dressing type: petrolatum, bandage and pressure dressing    Specimen 1 - Surgical pathology Differential Diagnosis: Bx proven Atypical Nevus  Check Margins: yes Tagged lateral [id number] 1.5cm hyperpigmented patch with hypertrichosis, within 3.95mm pigmented pink pap     Return in about 1 week (around 12/13/2022) for Suture Removal, with Dr. Katrinka Blazing.  Anise Salvo, RMA, am acting as scribe for Elie Goody, MD .   Documentation: I have reviewed the above documentation for accuracy and completeness, and I agree with the above.  Elie Goody, MD

## 2022-12-06 NOTE — Patient Instructions (Signed)

## 2022-12-06 NOTE — Telephone Encounter (Signed)
Patient doing fine with zero pain following todays surgery. Butch Penny., RMA

## 2022-12-08 LAB — SURGICAL PATHOLOGY

## 2022-12-13 ENCOUNTER — Ambulatory Visit: Payer: Managed Care, Other (non HMO) | Admitting: Dermatology

## 2022-12-13 ENCOUNTER — Encounter: Payer: Self-pay | Admitting: Dermatology

## 2022-12-13 DIAGNOSIS — Z4802 Encounter for removal of sutures: Secondary | ICD-10-CM

## 2022-12-13 NOTE — Patient Instructions (Addendum)
Due to recent changes in healthcare laws, you may see results of your pathology and/or laboratory studies on MyChart before the doctors have had a chance to review them. We understand that in some cases there may be results that are confusing or concerning to you. Please understand that not all results are received at the same time and often the doctors may need to interpret multiple results in order to provide you with the best plan of care or course of treatment. Therefore, we ask that you please give [place] 2 business days to thoroughly review all your results before contacting the office for clarification. Should we see a critical lab result, you will be contacted sooner.   If You Need Anything After Your Visit  If you have any questions or concerns for your doctor, please call our main line at [phone] and press option 4 to reach your doctor's medical assistant. If no one answers, please leave a voicemail as directed and we will return your call as soon as possible. Messages left after 4 pm will be answered the following business day.   You may also send [place] a message via MyChart. We typically respond to MyChart messages within 1-2 business days.  For prescription refills, please ask your pharmacy to contact our office. Our fax number is [phone].  If you have an urgent issue when the clinic is closed that cannot wait until the next business day, you can page your doctor at the number below.    Please note that while we do our best to be available for urgent issues outside of office hours, we are not available 24/7.   If you have an urgent issue and are unable to reach [place], you may choose to seek medical care at your doctor's office, retail clinic, urgent care center, or emergency room.  If you have a medical emergency, please immediately call 911 or go to the emergency department.  Pager Numbers  - Dr. Gwen Pounds: [phone]  - Dr. Roseanne Reno: [phone]  - Dr. Katrinka Blazing: [phone]    In the event of inclement weather, please call our main line at [phone] for an update on the status of any delays or closures.  Dermatology Medication Tips: Please keep the boxes that topical medications come in in order to help keep track of the instructions about where and how to use these. Pharmacies typically print the medication instructions only on the boxes and not directly on the medication tubes.   If your medication is too expensive, please contact our office at [phone] option 4 or send [place] a message through MyChart.   We are unable to tell what your co-pay for medications will be in advance as this is different depending on your insurance coverage. However, we may be able to find a substitute medication at lower cost or fill out paperwork to get insurance to cover a needed medication.   If a prior authorization is required to get your medication covered by your insurance company, please allow [place] 1-2 business days to complete this process.  Drug prices often vary depending on where the prescription is filled and some pharmacies may offer cheaper prices.  The website www.goodrx.com contains coupons for medications through different pharmacies. The prices here do not account for what the cost may be with help from insurance (it may be cheaper with your insurance), but the website can give you the price if you did not use any insurance.  - You can print the associated coupon and take it  with your prescription to the pharmacy.  - You may also stop by our office during regular business hours and pick up a GoodRx coupon card.  - If you need your prescription sent electronically to a different pharmacy, notify our office through [facility] or by phone at [phone] option 4.

## 2022-12-13 NOTE — Progress Notes (Addendum)
   Follow-Up Visit   Subjective  Eddie Lopez is a 42 y.o. male who presents for the following: Suture removal  Pathology showed RESIDUAL ATYPICAL MELANOCYTIC PROLIFERATION, MARGINS FREE   The following portions of the chart were reviewed this encounter and updated as appropriate: medications, allergies, medical history  Review of Systems:  No other skin or systemic complaints except as noted in HPI or Assessment and Plan.  Objective  Well appearing patient in no apparent distress; mood and affect are within normal limits.  Areas Examined: Right arm Relevant physical exam findings are noted in the Assessment and Plan.    Assessment & Plan    Encounter for Removal of Sutures - Incision site is clean, dry and intact. - Wound cleansed, sutures removed, wound cleansed and steri strips applied.  - Discussed pathology results showing RESIDUAL ATYPICAL MELANOCYTIC PROLIFERATION, MARGINS FREE  - Patient advised to keep steri-strips dry until they fall off. - Scars remodel for a full year. - Once steri-strips fall off, patient can apply over-the-counter silicone scar cream once to twice a day to help with scar remodeling if desired. - Patient advised to call with any concerns or if they notice any new or changing lesions.  Return for TBSE, next available, with Dr. Katrinka Blazing.  Anise Salvo, RMA, am acting as scribe for Elie Goody, MD .   Documentation: I have reviewed the above documentation for accuracy and completeness, and I agree with the above.  Elie Goody, MD

## 2022-12-24 ENCOUNTER — Other Ambulatory Visit: Payer: Self-pay | Admitting: Gastroenterology

## 2023-03-13 ENCOUNTER — Encounter: Payer: Self-pay | Admitting: Gastroenterology

## 2023-03-16 NOTE — Telephone Encounter (Signed)
 Patty, Please let Eddie Lopez know that I am happy that he is doing better. Hopefully this continues to be the case. Lets plan to see him at some point in the summer or fall of this year. Please make sure he has an EGD and colonoscopy recall for 2026. Thanks. GM

## 2023-05-29 ENCOUNTER — Telehealth: Payer: Self-pay | Admitting: Family Medicine

## 2023-05-29 NOTE — Telephone Encounter (Signed)
 Dr Birdie Sons is no longer at this location. Please call the office to schedule a Transfer of Care to either Dr Charlann Lange, Darleen Crocker or Kara Dies, NP   Thank you  E2C2, please schedule a TOC visit for this patient. [facility]

## 2023-06-15 ENCOUNTER — Other Ambulatory Visit: Payer: Self-pay

## 2023-06-15 DIAGNOSIS — R002 Palpitations: Secondary | ICD-10-CM

## 2023-06-15 MED ORDER — NEBIVOLOL HCL 2.5 MG PO TABS: 2.5000 mg | ORAL_TABLET | Freq: Every day | ORAL | 1 refills | Status: AC

## 2023-10-26 ENCOUNTER — Encounter: Payer: Managed Care, Other (non HMO) | Admitting: Family Medicine
